# Patient Record
Sex: Female | Born: 1940 | Race: White | Hispanic: No | Smoking: Former smoker
Health system: Southern US, Community
[De-identification: ages and names within clinical notes are randomized; demographics above are authoritative.]

## PROBLEM LIST (undated history)

## (undated) DIAGNOSIS — L089 Local infection of the skin and subcutaneous tissue, unspecified: Secondary | ICD-10-CM

## (undated) DIAGNOSIS — F419 Anxiety disorder, unspecified: Secondary | ICD-10-CM

## (undated) DIAGNOSIS — R002 Palpitations: Secondary | ICD-10-CM

## (undated) DIAGNOSIS — I10 Essential (primary) hypertension: Secondary | ICD-10-CM

## (undated) DIAGNOSIS — R51 Headache: Secondary | ICD-10-CM

## (undated) HISTORY — DX: Essential (primary) hypertension: I10

## (undated) HISTORY — DX: Anxiety disorder, unspecified: F41.9

## (undated) HISTORY — DX: Headache: R51

## (undated) HISTORY — DX: Palpitations: R00.2

## (undated) HISTORY — DX: Local infection of the skin and subcutaneous tissue, unspecified: L08.9

---

## 1997-10-09 ENCOUNTER — Other Ambulatory Visit: Admission: RE | Admit: 1997-10-09 | Discharge: 1997-10-09 | Payer: Self-pay | Admitting: *Deleted

## 1999-01-06 ENCOUNTER — Other Ambulatory Visit: Admission: RE | Admit: 1999-01-06 | Discharge: 1999-01-06 | Payer: Self-pay | Admitting: *Deleted

## 1999-06-17 ENCOUNTER — Other Ambulatory Visit: Admission: RE | Admit: 1999-06-17 | Discharge: 1999-06-17 | Payer: Self-pay | Admitting: *Deleted

## 2000-01-06 ENCOUNTER — Other Ambulatory Visit: Admission: RE | Admit: 2000-01-06 | Discharge: 2000-01-06 | Payer: Self-pay | Admitting: *Deleted

## 2001-01-15 ENCOUNTER — Other Ambulatory Visit: Admission: RE | Admit: 2001-01-15 | Discharge: 2001-01-15 | Payer: Self-pay | Admitting: *Deleted

## 2004-07-30 ENCOUNTER — Ambulatory Visit: Payer: Self-pay | Admitting: Internal Medicine

## 2005-03-22 ENCOUNTER — Ambulatory Visit: Payer: Self-pay | Admitting: Internal Medicine

## 2008-10-12 ENCOUNTER — Emergency Department (HOSPITAL_COMMUNITY): Admission: EM | Admit: 2008-10-12 | Discharge: 2008-10-12 | Payer: Self-pay | Admitting: Emergency Medicine

## 2009-06-17 ENCOUNTER — Encounter: Payer: Self-pay | Admitting: Internal Medicine

## 2009-06-18 ENCOUNTER — Encounter: Payer: Self-pay | Admitting: Internal Medicine

## 2009-06-18 ENCOUNTER — Ambulatory Visit (HOSPITAL_BASED_OUTPATIENT_CLINIC_OR_DEPARTMENT_OTHER): Admission: RE | Admit: 2009-06-18 | Discharge: 2009-06-18 | Payer: Self-pay | Admitting: Orthopedic Surgery

## 2009-06-24 ENCOUNTER — Encounter: Payer: Self-pay | Admitting: Internal Medicine

## 2009-07-12 ENCOUNTER — Encounter: Payer: Self-pay | Admitting: Internal Medicine

## 2009-07-14 ENCOUNTER — Ambulatory Visit: Payer: Self-pay | Admitting: Internal Medicine

## 2009-07-14 DIAGNOSIS — R197 Diarrhea, unspecified: Secondary | ICD-10-CM

## 2009-07-14 DIAGNOSIS — R110 Nausea: Secondary | ICD-10-CM | POA: Insufficient documentation

## 2009-07-14 DIAGNOSIS — R519 Headache, unspecified: Secondary | ICD-10-CM | POA: Insufficient documentation

## 2009-07-14 DIAGNOSIS — R51 Headache: Secondary | ICD-10-CM

## 2010-03-09 NOTE — Letter (Signed)
Summary: [facility] [place]   Imported By: Maryln Gottron 07/23/2009 15:09:17  _____________________________________________________________________  External Attachment:    Type:   Image     Comment:   External Document

## 2010-03-09 NOTE — Letter (Signed)
Summary: [facility] [place]   Imported By: Maryln Gottron 07/23/2009 15:10:40  _____________________________________________________________________  External Attachment:    Type:   Image     Comment:   External Document

## 2010-03-09 NOTE — Assessment & Plan Note (Signed)
Summary: re-est ok per kim//ccm                                                                                                 Vital Signs:  Patient profile:   70 year old female Height:      63 inches Weight:      140 pounds BMI:     24.89 Temp:     98.2 degrees F oral BP sitting:   124 / 76  (right arm) Cuff size:   regular  Vitals Entered By: Duard Brady LPN (July 14, 100 11:20 AM) CC: c/o diarrhea and chills , on antibiotics r/t hand surg 3 wks ago Is Patient Diabetic? No   CC:  c/o diarrhea and chills  and on antibiotics r/t hand surg 3 wks ago.  History of Present Illness: 70 year old patient who has been seen at the urgent care recently due to an acute febrile illness.  She has been treated with the Levaquin and doxycycline and more recently has completed sulfamethoxazole following a cat bite and surgery.  She was last seen at the time care on Sunday with fever and chills.  Today she is improved, but still having persistent nausea and diarrhea.  There is been no vomiting.  Denies any abdominal pain.  Her left hand wound infection has nicely healed  Preventive Screening-Counseling & Management  Alcohol-Tobacco     Smoking Status: never  Allergies: 1)  ! Penicillin V Potassium (Penicillin V Potassium)  Social History: Smoking Status:  never  Review of Systems       The patient complains of anorexia and fever.  The patient denies weight loss, weight gain, vision loss, decreased hearing, hoarseness, chest pain, syncope, dyspnea on exertion, peripheral edema, prolonged cough, headaches, hemoptysis, abdominal pain, melena, hematochezia, severe indigestion/heartburn, hematuria, incontinence, genital sores, muscle weakness, suspicious skin lesions, transient blindness, difficulty walking, depression, unusual weight change, abnormal bleeding, enlarged lymph nodes, angioedema, and breast masses.    Physical Exam  General:  Well-developed,well-nourished,in no acute  distress; alert,appropriate and cooperative throughout examination Head:  Normocephalic and atraumatic without obvious abnormalities. No apparent alopecia or balding. Eyes:  No corneal or conjunctival inflammation noted. EOMI. Perrla. Funduscopic exam benign, without hemorrhages, exudates or papilledema. Vision grossly normal. Ears:  External ear exam shows no significant lesions or deformities.  Otoscopic examination reveals clear canals, tympanic membranes are intact bilaterally without bulging, retraction, inflammation or discharge. Hearing is grossly normal bilaterally. Mouth:  Oral mucosa and oropharynx without lesions or exudates.  Teeth in good repair. Neck:  No deformities, masses, or tenderness noted. Lungs:  Normal respiratory effort, chest expands symmetrically. Lungs are clear to auscultation, no crackles or wheezes. Heart:  Normal rate and regular rhythm. S1 and S2 normal without gallop, murmur, click, rub or other extra sounds. Abdomen:  Bowel sounds positive,abdomen soft and non-tender without masses, organomegaly or hernias noted. Msk:  No deformity or scoliosis noted of thoracic or lumbar spine.   Pulses:  R and L carotid,radial,femoral,dorsalis pedis and posterior tibial pulses are full and equal bilaterally Extremities:  No clubbing, cyanosis, edema, or  deformity noted with normal full range of motion of all joints.     Impression & Recommendations:  Problem # 1:  NAUSEA ALONE (ICD-787.02)  Problem # 2:  DIARRHEA, ACUTE (ICD-787.91)  Complete Medication List: 1)  Caltrate 600+d 600-400 Mg-unit Tabs (Calcium carbonate-vitamin d) 2)  Levaquin  .... For 3 weeks 3)  Doxycycline  .... For 3 weeks 4)  Sulfamethoxazole Tmp  .... For 3 weeks 5)  Promethazine Hcl 25 Mg Tabs (Promethazine hcl) .... One every 6 hours for nausea  Patient Instructions: 1)  Drink clear liquids only for the next 24 hours, then slowly add other liquids and food as you  tolerate them. 2)  Align one  daily Prescriptions: PROMETHAZINE HCL 25 MG TABS (PROMETHAZINE HCL) one every 6 hours for nausea  #20 x 0   Entered and Authorized by:   Gordy Savers  MD   Signed by:   Gordy Savers  MD on 07/14/2009   Method used:   Print then Give to Patient   RxID:   [id number]

## 2010-03-09 NOTE — Op Note (Signed)
Summary: Incision and drainage of cat bite wound-Dr. Molly Maduro Sypher  Incision and drainage of cat bite wound-Dr. Molly Maduro Sypher   Imported By: Maryln Gottron 07/23/2009 15:12:35  _____________________________________________________________________  External Attachment:    Type:   Image     Comment:   External Document

## 2010-03-09 NOTE — Letter (Signed)
Summary: Records from [facility] 05/06/09 - 07/12/09  Records from [facility] 05/06/09 - 07/12/09   Imported By: Maryln Gottron 07/21/2009 12:49:28  _____________________________________________________________________  External Attachment:    Type:   Image     Comment:   External Document

## 2010-04-13 ENCOUNTER — Encounter (INDEPENDENT_AMBULATORY_CARE_PROVIDER_SITE_OTHER): Payer: Self-pay | Admitting: *Deleted

## 2010-04-13 ENCOUNTER — Other Ambulatory Visit: Payer: Self-pay | Admitting: Internal Medicine

## 2010-04-13 ENCOUNTER — Other Ambulatory Visit: Payer: MEDICARE

## 2010-04-13 DIAGNOSIS — Z0000 Encounter for general adult medical examination without abnormal findings: Secondary | ICD-10-CM

## 2010-04-13 DIAGNOSIS — E785 Hyperlipidemia, unspecified: Secondary | ICD-10-CM

## 2010-04-13 LAB — CBC WITH DIFFERENTIAL/PLATELET
Basophils Relative: 0.9 % (ref 0.0–3.0)
Eosinophils Absolute: 0.2 10*3/uL (ref 0.0–0.7)
Hemoglobin: 12.9 g/dL (ref 12.0–15.0)
MCHC: 34.4 g/dL (ref 30.0–36.0)
MCV: 91 fl (ref 78.0–100.0)
Monocytes Absolute: 0.7 10*3/uL (ref 0.1–1.0)
Neutro Abs: 4.9 10*3/uL (ref 1.4–7.7)
Neutrophils Relative %: 58.2 % (ref 43.0–77.0)
RBC: 4.13 Mil/uL (ref 3.87–5.11)

## 2010-04-13 LAB — HEPATIC FUNCTION PANEL
AST: 23 U/L (ref 0–37)
Albumin: 4.2 g/dL (ref 3.5–5.2)
Alkaline Phosphatase: 87 U/L (ref 39–117)
Total Protein: 7.1 g/dL (ref 6.0–8.3)

## 2010-04-13 LAB — LIPID PANEL: Cholesterol: 207 mg/dL — ABNORMAL HIGH (ref 0–200)

## 2010-04-13 LAB — BASIC METABOLIC PANEL
BUN: 13 mg/dL (ref 6–23)
Creatinine, Ser: 0.7 mg/dL (ref 0.4–1.2)
GFR: 84.79 mL/min (ref >60.00)

## 2010-04-13 LAB — URINALYSIS, ROUTINE W REFLEX MICROSCOPIC
Ketones, ur: NEGATIVE
Urine Glucose: NEGATIVE
Urobilinogen, UA: 0.2 (ref 0.0–1.0)

## 2010-04-22 ENCOUNTER — Encounter: Payer: Self-pay | Admitting: Internal Medicine

## 2010-04-23 ENCOUNTER — Encounter: Payer: Self-pay | Admitting: Internal Medicine

## 2010-04-23 ENCOUNTER — Ambulatory Visit (INDEPENDENT_AMBULATORY_CARE_PROVIDER_SITE_OTHER): Payer: MEDICARE | Admitting: Internal Medicine

## 2010-04-23 VITALS — BP 128/80 | HR 78 | Temp 98.8°F | Resp 14 | Ht 62.0 in | Wt 143.0 lb

## 2010-04-23 DIAGNOSIS — Z136 Encounter for screening for cardiovascular disorders: Secondary | ICD-10-CM

## 2010-04-23 DIAGNOSIS — Z0000 Encounter for general adult medical examination without abnormal findings: Secondary | ICD-10-CM

## 2010-04-23 NOTE — Patient Instructions (Signed)
colonoscopy   It is important that you exercise regularly, at least 20 minutes 3 to 4 times per week.  If you develop chest pain or shortness of breath seek  medical attention.  Take a calcium supplement, plus [phone] units of vitamin D  Schedule your mammogram.

## 2010-04-23 NOTE — Progress Notes (Signed)
Subjective:    Patient ID: Sydney Lucas, female    DOB: 08/09/1940, 70 y.o.   MRN: [id number]  HPI   70 year old patient who is seen today for a preventative health examination. She enjoys excellent health. She takes no chronic medications. She is followed by GYN and does take calcium and vitamin D supplements. She is quite active physically. 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  Past Medical History  Diagnosis Date  . Headache    No past surgical history on file.  reports that she quit smoking about 42 years ago. She has never used smokeless tobacco. She reports that she does not drink alcohol or use illicit drugs. family history is not on file. Allergies  Allergen Reactions  . Penicillins     REACTION: rash     1. Risk factors, based on past  M,S,F history-  No cardiovascular risk factors  2.  Physical activities: remains quite active. Exercises regularly at home. No exercise limitations.  3.  Depression/mood: no history of depression or mood disorder  4.  Hearing: no hearing deficits  5.  ADL's: independent in all aspects of daily living  6.  Fall risk: low  7.  Home safety: no problems identified  8.  Height weight, and visual acuity; height and weight stable. No change in visual acuity 9.  Counseling: heart healthy diet encouraged screening colonoscopy encouraged  10. Lab orders based on risk factors: laboratory profile including lipid panel and TSH reviewed 11. Referral-  We'll refer her for colonoscopy 12. Care plan:   Heart healthy diet regular exercise all encouraged we'll continue calcium and vitamin D supplements.  13. Cognitive assessment:  Lower down it with normal affect no history of cognitive dysfunction     Review of Systems  Constitutional: Negative for fever, appetite change, fatigue and unexpected weight change.  HENT: Negative for hearing loss, ear pain, nosebleeds, congestion, sore throat, mouth sores,  trouble swallowing, neck stiffness, dental problem, voice change, sinus pressure and tinnitus.   Eyes: Negative for photophobia, pain, redness and visual disturbance.  Respiratory: Negative for cough, chest tightness and shortness of breath.   Cardiovascular: Negative for chest pain, palpitations and leg swelling.  Gastrointestinal: Negative for nausea, vomiting, abdominal pain, diarrhea, constipation, blood in stool, abdominal distention and rectal pain.  Genitourinary: Negative for dysuria, urgency, frequency, hematuria, flank pain, vaginal bleeding, vaginal discharge, difficulty urinating, genital sores, vaginal pain, menstrual problem and pelvic pain.  Musculoskeletal: Negative for back pain and arthralgias.  Skin: Negative for rash.  Neurological: Negative for dizziness, syncope, speech difficulty, weakness, light-headedness, numbness and headaches.  Hematological: Negative for adenopathy. Does not bruise/bleed easily.  Psychiatric/Behavioral: Negative for suicidal ideas, behavioral problems, self-injury, dysphoric mood and agitation. The patient is not nervous/anxious.        Objective:   Physical Exam  Constitutional: She is oriented to person, place, and time. She appears well-developed and well-nourished.  HENT:  Head: Normocephalic and atraumatic.  Right Ear: External ear normal.  Left Ear: External ear normal.  Mouth/Throat: Oropharynx is clear and moist.  Eyes: Conjunctivae and EOM are normal.  Neck: Normal range of motion. Neck supple. No JVD present. No thyromegaly present.  Cardiovascular: Normal rate, regular rhythm, normal heart sounds and intact distal pulses.   No murmur heard. Pulmonary/Chest: Effort normal and breath sounds normal. She has no wheezes. She has no rales.  Abdominal: Soft. Bowel sounds are normal. She exhibits no distension and no mass. There is no  tenderness. There is no rebound and no guarding.  Musculoskeletal: Normal range of motion. She exhibits no  edema and no tenderness.  Neurological: She is alert and oriented to person, place, and time. She has normal reflexes. No cranial nerve deficit. She exhibits normal muscle tone. Coordination normal.  Skin: Skin is warm and dry. No rash noted.  Psychiatric: She has a normal mood and affect. Her behavior is normal.          Assessment & Plan:   preventive health examination   We'll continue calcium and vitamin D supplements regular exercise encouraged heart healthy diet encouraged a screening colonoscopy will be scheduled

## 2010-04-27 LAB — ANAEROBIC CULTURE

## 2010-04-27 LAB — TISSUE CULTURE

## 2010-04-27 LAB — WOUND CULTURE: Culture: NO GROWTH

## 2011-04-14 ENCOUNTER — Ambulatory Visit (INDEPENDENT_AMBULATORY_CARE_PROVIDER_SITE_OTHER): Payer: Medicare Other | Admitting: Internal Medicine

## 2011-04-14 ENCOUNTER — Encounter: Payer: Self-pay | Admitting: Internal Medicine

## 2011-04-14 VITALS — BP 100/70 | Temp 98.3°F | Wt 142.0 lb

## 2011-04-14 DIAGNOSIS — K5289 Other specified noninfective gastroenteritis and colitis: Secondary | ICD-10-CM

## 2011-04-14 DIAGNOSIS — R0602 Shortness of breath: Secondary | ICD-10-CM

## 2011-04-14 DIAGNOSIS — I479 Paroxysmal tachycardia, unspecified: Secondary | ICD-10-CM

## 2011-04-14 DIAGNOSIS — K529 Noninfective gastroenteritis and colitis, unspecified: Secondary | ICD-10-CM

## 2011-04-14 MED ORDER — VERAPAMIL HCL 80 MG PO TABS: 80.0000 mg | ORAL_TABLET | Freq: Three times a day (TID) | ORAL | Status: DC

## 2011-04-14 MED ORDER — PROMETHAZINE HCL 25 MG PO TABS: 25.0000 mg | ORAL_TABLET | Freq: Four times a day (QID) | ORAL | Status: DC | PRN

## 2011-04-14 NOTE — Progress Notes (Signed)
  Subjective:    Patient ID: Sydney Lucas, female    DOB: 1940/06/02, 71 y.o.   MRN: [id number]  HPI 71 year old patient who presents with a four-day history of nausea vomiting and diarrhea. The vomiting lasted approximately 12 hours but she remains nauseated her diarrhea is also tapered. She has been able to tolerate fluids. She feels that she is steadily improving;  no abnormal pain fever or chills.     Review of Systems  Constitutional: Negative.   HENT: Negative for hearing loss, congestion, sore throat, rhinorrhea, dental problem, sinus pressure and tinnitus.   Eyes: Negative for pain, discharge and visual disturbance.  Respiratory: Negative for cough and shortness of breath.   Cardiovascular: Negative for chest pain, palpitations and leg swelling.  Gastrointestinal: Positive for nausea, vomiting and diarrhea. Negative for abdominal pain, constipation, blood in stool and abdominal distention.  Genitourinary: Negative for dysuria, urgency, frequency, hematuria, flank pain, vaginal bleeding, vaginal discharge, difficulty urinating, vaginal pain and pelvic pain.  Musculoskeletal: Negative for joint swelling, arthralgias and gait problem.  Skin: Negative for rash.  Neurological: Negative for dizziness, syncope, speech difficulty, weakness, numbness and headaches.  Hematological: Negative for adenopathy.  Psychiatric/Behavioral: Negative for behavioral problems, dysphoric mood and agitation. The patient is not nervous/anxious.        Objective:   Physical Exam  Constitutional: She is oriented to person, place, and time. She appears well-developed and well-nourished.  HENT:  Head: Normocephalic.  Right Ear: External ear normal.  Left Ear: External ear normal.  Mouth/Throat: Oropharynx is clear and moist.  Eyes: Conjunctivae and EOM are normal. Pupils are equal, round, and reactive to light.  Neck: Normal range of motion. Neck supple. No thyromegaly present.    Cardiovascular: Normal heart sounds and intact distal pulses.        Pulse was fast and irregular  Pulmonary/Chest: Effort normal and breath sounds normal.  Abdominal: Soft. Bowel sounds are normal. She exhibits no distension and no mass. There is no tenderness. There is no rebound and no guarding.  Musculoskeletal: Normal range of motion.  Lymphadenopathy:    She has no cervical adenopathy.  Neurological: She is alert and oriented to person, place, and time.  Skin: Skin is warm and dry. No rash noted.  Psychiatric: She has a normal mood and affect. Her behavior is normal.          Assessment & Plan:  Viral gastroenteritis. We'll continue symptomatic treatment with anti-emetics and antidiarrheals. We'll force fluids we'll treat with Phenergan and gradually advance diet  Atrial fibrillation. EKG was performed and did confirm atrial fibrillation. Will place on breast milk 80 mg 3 times daily and recheck tomorrow. If remains in atrial fibrillation, we'll evaluate and treat further

## 2011-04-14 NOTE — Patient Instructions (Addendum)
The main concern with gastroenteritis is dehydration.  Drink  plenty of  fluids, and advance your diet  slowly to solids as you feel improved.  If you are unable to keep anything down or  Show  signs of dehydration such as dry, cracked lips, or  not urinating, please notify our office.  Verapamil 80 mg 3 times daily  Return in 24 hours for further evaluation  Viral Gastroenteritis Viral gastroenteritis is also known as stomach flu. This condition affects the stomach and intestinal tract. It can cause sudden diarrhea and vomiting. The illness typically lasts 3 to 8 days. Most people develop an immune response that eventually gets rid of the virus. While this natural response develops, the virus can make you quite ill. CAUSES   Many different viruses can cause gastroenteritis, such as rotavirus or noroviruses. You can catch one of these viruses by consuming contaminated food or water. You may also catch a virus by sharing utensils or other personal items with an infected person or by touching a contaminated surface. SYMPTOMS   The most common symptoms are diarrhea and vomiting. These problems can cause a severe loss of body fluids (dehydration) and a body salt (electrolyte) imbalance. Other symptoms may include:  Fever.   Headache.   Fatigue.   Abdominal pain.  DIAGNOSIS   Your caregiver can usually diagnose viral gastroenteritis based on your symptoms and a physical exam. A stool sample may also be taken to test for the presence of viruses or other infections. TREATMENT   This illness typically goes away on its own. Treatments are aimed at rehydration. The most serious cases of viral gastroenteritis involve vomiting so severely that you are not able to keep fluids down. In these cases, fluids must be given through an intravenous line (IV). HOME CARE INSTRUCTIONS    Drink enough fluids to keep your urine clear or pale yellow. Drink small amounts of fluids frequently and increase the amounts  as tolerated.   Ask your caregiver for specific rehydration instructions.   Avoid:   Foods high in sugar.   Alcohol.   Carbonated drinks.   Tobacco.   Juice.   Caffeine drinks.   Extremely hot or cold fluids.   Fatty, greasy foods.   Too much intake of anything at one time.   Dairy products until 24 to 48 hours after diarrhea stops.   You may consume probiotics. Probiotics are active cultures of beneficial bacteria. They may lessen the amount and number of diarrheal stools in adults. Probiotics can be found in yogurt with active cultures and in supplements.   Wash your hands well to avoid spreading the virus.   Only take over-the-counter or prescription medicines for pain, discomfort, or fever as directed by your caregiver. Do not give aspirin to children. Antidiarrheal medicines are not recommended.   Ask your caregiver if you should continue to take your regular prescribed and over-the-counter medicines.   Keep all follow-up appointments as directed by your caregiver.  SEEK IMMEDIATE MEDICAL CARE IF:    You are unable to keep fluids down.   You do not urinate at least once every 6 to 8 hours.   You develop shortness of breath.   You notice blood in your stool or vomit. This may look like coffee grounds.   You have abdominal pain that increases or is concentrated in one small area (localized).   You have persistent vomiting or diarrhea.   You have a fever.   The patient is a  child younger than 3 months, and he or she has a fever.   The patient is a child older than 3 months, and he or she has a fever and persistent symptoms.   The patient is a child older than 3 months, and he or she has a fever and symptoms suddenly get worse.   The patient is a baby, and he or she has no tears when crying.  MAKE SURE YOU:    Understand these instructions.   Will watch your condition.   Will get help right away if you are not doing well or get worse.  Document  Released: 01/24/2005 Document Revised: 01/13/2011 Document Reviewed: 11/10/2010 [facility] Patient Information 2012 [place], [place].

## 2011-04-15 ENCOUNTER — Encounter: Payer: Self-pay | Admitting: Internal Medicine

## 2011-04-15 ENCOUNTER — Ambulatory Visit (INDEPENDENT_AMBULATORY_CARE_PROVIDER_SITE_OTHER): Payer: Medicare Other | Admitting: Internal Medicine

## 2011-04-15 VITALS — BP 120/70 | HR 72 | Temp 98.4°F

## 2011-04-15 DIAGNOSIS — I4891 Unspecified atrial fibrillation: Secondary | ICD-10-CM

## 2011-04-15 DIAGNOSIS — I480 Paroxysmal atrial fibrillation: Secondary | ICD-10-CM | POA: Insufficient documentation

## 2011-04-15 NOTE — Progress Notes (Signed)
  Subjective:    Patient ID: Sydney Lucas, female    DOB: 1940/06/06, 71 y.o.   MRN: [id number]  HPI 71 year old patient who was seen yesterday due to the acute illness. Today she is feeling much better. Yesterday she was noted to have an irregular rhythm. EKG confirmed atrial fibrillation.   Review of Systems  Constitutional: Negative.   HENT: Negative for hearing loss, congestion, sore throat, rhinorrhea, dental problem, sinus pressure and tinnitus.   Eyes: Negative for pain, discharge and visual disturbance.  Respiratory: Negative for cough and shortness of breath.   Cardiovascular: Negative for chest pain, palpitations (palpitations were noted by the patient yesterday but have not reoccurred) and leg swelling.  Gastrointestinal: Negative for nausea, vomiting, abdominal pain, diarrhea, constipation, blood in stool and abdominal distention.  Genitourinary: Negative for dysuria, urgency, frequency, hematuria, flank pain, vaginal bleeding, vaginal discharge, difficulty urinating, vaginal pain and pelvic pain.  Musculoskeletal: Negative for joint swelling, arthralgias and gait problem.  Skin: Negative for rash.  Neurological: Negative for dizziness, syncope, speech difficulty, weakness, numbness and headaches.  Hematological: Negative for adenopathy.  Psychiatric/Behavioral: Negative for behavioral problems, dysphoric mood and agitation. The patient is not nervous/anxious.        Objective:   Physical Exam  Constitutional: She appears well-developed and well-nourished. No distress.       Blood pressure normal Pulse is regular without ectopy          Assessment & Plan:   History of paroxysmal atrial fibrillation. Clinically patient is normal sinus rhythm at the present time. Options were discussed. It was elected to follow clinically the patient monitor her pulse and call for any irregularity.

## 2011-04-15 NOTE — Patient Instructions (Signed)
Notify if you note any irregularity to your pulse  Schedule your regular annual complete examination  Limit your sodium (Salt) intake

## 2011-04-26 LAB — HM COLONOSCOPY

## 2011-04-27 ENCOUNTER — Encounter: Payer: Self-pay | Admitting: Internal Medicine

## 2011-07-26 ENCOUNTER — Other Ambulatory Visit (INDEPENDENT_AMBULATORY_CARE_PROVIDER_SITE_OTHER): Payer: Medicare Other

## 2011-07-26 DIAGNOSIS — Z0000 Encounter for general adult medical examination without abnormal findings: Secondary | ICD-10-CM

## 2011-07-26 DIAGNOSIS — Z79899 Other long term (current) drug therapy: Secondary | ICD-10-CM

## 2011-07-26 LAB — CBC WITH DIFFERENTIAL/PLATELET
Basophils Relative: 1.1 % (ref 0.0–3.0)
Eosinophils Relative: 2.6 % (ref 0.0–5.0)
HCT: 36.2 % (ref 36.0–46.0)
Hemoglobin: 12.2 g/dL (ref 12.0–15.0)
Lymphs Abs: 2.5 10*3/uL (ref 0.7–4.0)
MCV: 92.2 fl (ref 78.0–100.0)
Monocytes Absolute: 0.6 10*3/uL (ref 0.1–1.0)
Neutro Abs: 3.7 10*3/uL (ref 1.4–7.7)
RBC: 3.93 Mil/uL (ref 3.87–5.11)
WBC: 7.1 10*3/uL (ref 4.5–10.5)

## 2011-07-26 LAB — POCT URINALYSIS DIPSTICK
Bilirubin, UA: NEGATIVE
Glucose, UA: NEGATIVE
Ketones, UA: NEGATIVE
Leukocytes, UA: NEGATIVE
pH, UA: 6.5

## 2011-07-26 LAB — LIPID PANEL
Cholesterol: 198 mg/dL (ref 0–200)
Triglycerides: 218 mg/dL — ABNORMAL HIGH (ref 0.0–149.0)

## 2011-07-26 LAB — HEPATIC FUNCTION PANEL
ALT: 18 U/L (ref 0–35)
AST: 25 U/L (ref 0–37)
Total Protein: 7.2 g/dL (ref 6.0–8.3)

## 2011-07-26 LAB — BASIC METABOLIC PANEL
BUN: 12 mg/dL (ref 6–23)
Chloride: 108 mEq/L (ref 96–112)
Potassium: 3.8 mEq/L (ref 3.5–5.1)
Sodium: 143 mEq/L (ref 135–145)

## 2011-07-27 LAB — LDL CHOLESTEROL, DIRECT: Direct LDL: 112.4 mg/dL

## 2011-08-01 ENCOUNTER — Encounter: Payer: Self-pay | Admitting: Internal Medicine

## 2011-08-01 LAB — HM MAMMOGRAPHY: HM Mammogram: NEGATIVE

## 2011-08-02 ENCOUNTER — Encounter: Payer: Self-pay | Admitting: Internal Medicine

## 2011-08-02 ENCOUNTER — Ambulatory Visit (INDEPENDENT_AMBULATORY_CARE_PROVIDER_SITE_OTHER): Payer: Medicare Other | Admitting: Internal Medicine

## 2011-08-02 VITALS — BP 120/70 | HR 82 | Temp 98.2°F | Resp 16 | Ht 62.0 in | Wt 145.0 lb

## 2011-08-02 DIAGNOSIS — Z86010 Personal history of colonic polyps: Secondary | ICD-10-CM

## 2011-08-02 DIAGNOSIS — I480 Paroxysmal atrial fibrillation: Secondary | ICD-10-CM

## 2011-08-02 DIAGNOSIS — Z0000 Encounter for general adult medical examination without abnormal findings: Secondary | ICD-10-CM

## 2011-08-02 DIAGNOSIS — I4891 Unspecified atrial fibrillation: Secondary | ICD-10-CM

## 2011-08-02 NOTE — Progress Notes (Signed)
Subjective:    Patient ID: Sydney Lucas, female    DOB: 02/01/1941, 71 y.o.   MRN: [id number]  HPI  71 year-old patient who is seen today for a preventative health examination. She enjoys excellent health. She takes no chronic medications. She is followed by GYN and does take calcium and vitamin D supplements. She is quite active physically. 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  Approximately 3 months ago she presented with paroxysmal atrial fibrillation in the setting of an acute diarrheal illness. She was seen the following day and had converted to a normal sinus rhythm. Over the past 3 months she has had a single episode of irregular heart rhythm lasting 4-5 minutes.  She's had a recent gynecologic exam mammogram and had a colonoscopy earlier this year. This revealed colonic polyps  Past medical history is fairly unremarkable. She is a gravida 1 para we'll want abortus 0 No hospitalizations. She takes no chronic medications Surgical history none except for outpatient hand surgery  Social history nonsmoker  Family history mother is 71 history of type 2 diabetes father died 24 history of heart failure and bladder cancer one sister age 36 is in good health  Past Medical History  Diagnosis Date  . Headache    No past surgical history on file.  reports that she quit smoking about 43 years ago. She has never used smokeless tobacco. She reports that she does not drink alcohol or use illicit drugs. family history is not on file. Allergies  Allergen Reactions  . Penicillins     REACTION: rash     1. Risk factors, based on past  M,S,F history-  No cardiovascular risk factors  2.  Physical activities: remains quite active. Exercises regularly at home. No exercise limitations.  3.  Depression/mood: no history of depression or mood disorder  4.  Hearing: no hearing deficits  5.  ADL's: independent in all aspects of daily living  6.  Fall  risk: low  7.  Home safety: no problems identified  8.  Height weight, and visual acuity; height and weight stable. No change in visual acuity 9.  Counseling: heart healthy diet encouraged screening colonoscopy encouraged  10. Lab orders based on risk factors: laboratory profile including lipid panel and TSH reviewed 11. Referral-  We'll refer her for colonoscopy 12. Care plan:   Heart healthy diet regular exercise all encouraged we'll continue calcium and vitamin D supplements.  13. Cognitive assessment:  Lower down it with normal affect no history of cognitive dysfunction     Review of Systems  Constitutional: Negative for fever, appetite change, fatigue and unexpected weight change.  HENT: Negative for hearing loss, ear pain, nosebleeds, congestion, sore throat, mouth sores, trouble swallowing, neck stiffness, dental problem, voice change, sinus pressure and tinnitus.   Eyes: Negative for photophobia, pain, redness and visual disturbance.  Respiratory: Negative for cough, chest tightness and shortness of breath.   Cardiovascular: Positive for palpitations (single episode of palpitations lasting 4-5 minutes). Negative for chest pain and leg swelling.  Gastrointestinal: Negative for nausea, vomiting, abdominal pain, diarrhea, constipation, blood in stool, abdominal distention and rectal pain.  Genitourinary: Negative for dysuria, urgency, frequency, hematuria, flank pain, vaginal bleeding, vaginal discharge, difficulty urinating, genital sores, vaginal pain, menstrual problem and pelvic pain.  Musculoskeletal: Negative for back pain and arthralgias.  Skin: Negative for rash.  Neurological: Negative for dizziness, syncope, speech difficulty, weakness, light-headedness, numbness and headaches.  Hematological: Negative for adenopathy. Does  not bruise/bleed easily.  Psychiatric/Behavioral: Negative for suicidal ideas, behavioral problems, self-injury, dysphoric mood and agitation. The  patient is not nervous/anxious.        Objective:   Physical Exam  Constitutional: She is oriented to person, place, and time. She appears well-developed and well-nourished.  HENT:  Head: Normocephalic and atraumatic.  Right Ear: External ear normal.  Left Ear: External ear normal.  Mouth/Throat: Oropharynx is clear and moist.  Eyes: Conjunctivae and EOM are normal.  Neck: Normal range of motion. Neck supple. No JVD present. No thyromegaly present.  Cardiovascular: Normal rate, regular rhythm, normal heart sounds and intact distal pulses.   No murmur heard. Pulmonary/Chest: Effort normal and breath sounds normal. She has no wheezes. She has no rales.  Abdominal: Soft. Bowel sounds are normal. She exhibits no distension and no mass. There is no tenderness. There is no rebound and no guarding.  Musculoskeletal: Normal range of motion. She exhibits no edema and no tenderness.  Neurological: She is alert and oriented to person, place, and time. She has normal reflexes. No cranial nerve deficit. She exhibits normal muscle tone. Coordination normal.  Skin: Skin is warm and dry. No rash noted.  Psychiatric: She has a normal mood and affect. Her behavior is normal.          Assessment & Plan:   preventive health examination   We'll continue calcium and vitamin D supplements regular exercise encouraged heart healthy diet encouraged a screening colonoscopy will be scheduled  Paroxysmal atrial fibrillation. We'll schedule a 2-D echocardiogram. We'll recommend aspirin 81 mg daily. If 2-D echocardiogram is normal,  we'll continue aspirin therapy only with a CHADS score of 0  History colonic polyps. Followup 3 years

## 2011-08-02 NOTE — Patient Instructions (Signed)
Take a calcium supplement, plus [phone] units of vitamin D    It is important that you exercise regularly, at least 20 minutes 3 to 4 times per week.  If you develop chest pain or shortness of breath seek  medical attention.  Aspirin 81 mg daily

## 2011-08-09 ENCOUNTER — Ambulatory Visit (HOSPITAL_COMMUNITY): Payer: Medicare Other | Attending: Cardiology | Admitting: Radiology

## 2011-08-09 DIAGNOSIS — I059 Rheumatic mitral valve disease, unspecified: Secondary | ICD-10-CM | POA: Insufficient documentation

## 2011-08-09 DIAGNOSIS — R002 Palpitations: Secondary | ICD-10-CM | POA: Insufficient documentation

## 2011-08-09 DIAGNOSIS — I359 Nonrheumatic aortic valve disorder, unspecified: Secondary | ICD-10-CM | POA: Insufficient documentation

## 2011-08-09 DIAGNOSIS — I4891 Unspecified atrial fibrillation: Secondary | ICD-10-CM | POA: Insufficient documentation

## 2011-08-09 DIAGNOSIS — I480 Paroxysmal atrial fibrillation: Secondary | ICD-10-CM

## 2011-08-09 NOTE — Progress Notes (Signed)
Echocardiogram performed.  

## 2011-08-12 NOTE — Progress Notes (Signed)
Quick Note:  Attempt to call- VM - LMTCB if questions - test normal ______

## 2012-08-16 ENCOUNTER — Other Ambulatory Visit (INDEPENDENT_AMBULATORY_CARE_PROVIDER_SITE_OTHER): Payer: Medicare Other

## 2012-08-16 DIAGNOSIS — Z0000 Encounter for general adult medical examination without abnormal findings: Secondary | ICD-10-CM

## 2012-08-16 DIAGNOSIS — I4891 Unspecified atrial fibrillation: Secondary | ICD-10-CM

## 2012-08-16 LAB — BASIC METABOLIC PANEL
CO2: 25 mEq/L (ref 19–32)
Calcium: 9.4 mg/dL (ref 8.4–10.5)
Chloride: 105 mEq/L (ref 96–112)
Creatinine, Ser: 0.7 mg/dL (ref 0.4–1.2)
Sodium: 141 mEq/L (ref 135–145)

## 2012-08-16 LAB — POCT URINALYSIS DIPSTICK
Ketones, UA: NEGATIVE
Leukocytes, UA: NEGATIVE
Nitrite, UA: NEGATIVE
Protein, UA: NEGATIVE
Urobilinogen, UA: 0.2
pH, UA: 7

## 2012-08-16 LAB — LIPID PANEL
HDL: 41.2 mg/dL (ref >39.00)
Total CHOL/HDL Ratio: 5
Triglycerides: 270 mg/dL — ABNORMAL HIGH (ref 0.0–149.0)
VLDL: 54 mg/dL — ABNORMAL HIGH (ref 0.0–40.0)

## 2012-08-16 LAB — HEPATIC FUNCTION PANEL
Albumin: 4 g/dL (ref 3.5–5.2)
Alkaline Phosphatase: 78 U/L (ref 39–117)
Total Protein: 7.1 g/dL (ref 6.0–8.3)

## 2012-08-16 LAB — CBC WITH DIFFERENTIAL/PLATELET
Basophils Relative: 1 % (ref 0.0–3.0)
Eosinophils Absolute: 0.3 10*3/uL (ref 0.0–0.7)
Eosinophils Relative: 3.9 % (ref 0.0–5.0)
Hemoglobin: 12.2 g/dL (ref 12.0–15.0)
Lymphocytes Relative: 33.1 % (ref 12.0–46.0)
MCHC: 33.9 g/dL (ref 30.0–36.0)
Neutro Abs: 4.3 10*3/uL (ref 1.4–7.7)
Neutrophils Relative %: 53.4 % (ref 43.0–77.0)
RBC: 3.88 Mil/uL (ref 3.87–5.11)
WBC: 8.1 10*3/uL (ref 4.5–10.5)

## 2012-08-16 LAB — TSH: TSH: 1.43 u[IU]/mL (ref 0.35–5.50)

## 2012-08-16 LAB — LDL CHOLESTEROL, DIRECT: Direct LDL: 110.7 mg/dL

## 2012-08-23 ENCOUNTER — Ambulatory Visit (INDEPENDENT_AMBULATORY_CARE_PROVIDER_SITE_OTHER): Payer: Medicare Other | Admitting: Internal Medicine

## 2012-08-23 ENCOUNTER — Encounter: Payer: Self-pay | Admitting: Internal Medicine

## 2012-08-23 VITALS — BP 120/80 | HR 74 | Temp 98.3°F | Resp 20 | Ht 61.5 in | Wt 139.0 lb

## 2012-08-23 DIAGNOSIS — Z0000 Encounter for general adult medical examination without abnormal findings: Secondary | ICD-10-CM

## 2012-08-23 DIAGNOSIS — I480 Paroxysmal atrial fibrillation: Secondary | ICD-10-CM

## 2012-08-23 DIAGNOSIS — Z86010 Personal history of colonic polyps: Secondary | ICD-10-CM

## 2012-08-23 DIAGNOSIS — I4891 Unspecified atrial fibrillation: Secondary | ICD-10-CM

## 2012-08-23 MED ORDER — ASPIRIN EC 81 MG PO TBEC: 81.0000 mg | DELAYED_RELEASE_TABLET | Freq: Every day | ORAL | Status: DC

## 2012-08-23 NOTE — Patient Instructions (Addendum)
.    It is important that you exercise regularly, at least 20 minutes 3 to 4 times per week.  If you develop chest pain or shortness of breath seek  medical attention.  Return in one year for follow-up  Take a calcium supplement, plus [phone] units of vitamin D

## 2012-08-23 NOTE — Progress Notes (Signed)
Patient ID: Sydney Lucas, female   DOB: 1940/11/04, 72 y.o.   MRN: [id number]  Subjective:    Patient ID: Sydney Lucas, female    DOB: 1940-06-29, 72 y.o.   MRN: [id number]  HPI 59 ear-old patient who is seen today for a preventative health examination. She enjoys excellent health. She takes no chronic medications. She is followed by GYN and does take calcium and vitamin D supplements. She is quite active physically. 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  Approximately 15 months ago she presented with paroxysmal atrial fibrillation in the setting of an acute diarrheal illness. She was seen the following day and had converted to a normal sinus rhythm. Over the past year she has had no further palpitations. She remains on baby aspirin 81 mg daily   Colonoscopy 2013 - This revealed colonic polyps  Past medical history is fairly unremarkable. She is a gravida 1 para we'll want abortus 0 No hospitalizations. She takes no chronic medications Surgical history none except for outpatient hand surgery  Social history nonsmoker  Family history mother is 53 history of type 2 diabetes father died 66 history of heart failure and bladder cancer one sister age 31 is in good health  Past Medical History  Diagnosis Date  . Headache(784.0)    History reviewed. No pertinent past surgical history.  reports that she quit smoking about 44 years ago. She has never used smokeless tobacco. She reports that she does not drink alcohol or use illicit drugs. family history is not on file. Allergies  Allergen Reactions  . Penicillins     REACTION: rash     1. Risk factors, based on past  M,S,F history-  No cardiovascular risk factors  2.  Physical activities: remains quite active. Exercises regularly at home. No exercise limitations.  3.  Depression/mood: no history of depression or mood disorder  4.  Hearing: no hearing deficits  5.  ADL's:  independent in all aspects of daily living  6.  Fall risk: low  7.  Home safety: no problems identified  8.  Height weight, and visual acuity; height and weight stable. No change in visual acuity 9.  Counseling: heart healthy diet encouraged screening colonoscopy encouraged  10. Lab orders based on risk factors: laboratory profile including lipid panel and TSH reviewed 11. Referral-  We'll refer her for colonoscopy 12. Care plan:   Heart healthy diet regular exercise all encouraged we'll continue calcium and vitamin D supplements.  13. Cognitive assessment:  Lower down it with normal affect no history of cognitive dysfunction     Review of Systems  Constitutional: Negative for fever, appetite change, fatigue and unexpected weight change.  HENT: Negative for hearing loss, ear pain, nosebleeds, congestion, sore throat, mouth sores, trouble swallowing, neck stiffness, dental problem, voice change, sinus pressure and tinnitus.   Eyes: Negative for photophobia, pain, redness and visual disturbance.  Respiratory: Negative for cough, chest tightness and shortness of breath.   Cardiovascular: Positive for palpitations (single episode of palpitations lasting 4-5 minutes). Negative for chest pain and leg swelling.  Gastrointestinal: Negative for nausea, vomiting, abdominal pain, diarrhea, constipation, blood in stool, abdominal distention and rectal pain.  Genitourinary: Negative for dysuria, urgency, frequency, hematuria, flank pain, vaginal bleeding, vaginal discharge, difficulty urinating, genital sores, vaginal pain, menstrual problem and pelvic pain.  Musculoskeletal: Negative for back pain and arthralgias.  Skin: Negative for rash.  Neurological: Negative for dizziness, syncope, speech difficulty, weakness, light-headedness, numbness and  headaches.  Hematological: Negative for adenopathy. Does not bruise/bleed easily.  Psychiatric/Behavioral: Negative for suicidal ideas, behavioral  problems, self-injury, dysphoric mood and agitation. The patient is not nervous/anxious.        Objective:   Physical Exam  Constitutional: She is oriented to person, place, and time. She appears well-developed and well-nourished.  HENT:  Head: Normocephalic and atraumatic.  Right Ear: External ear normal.  Left Ear: External ear normal.  Mouth/Throat: Oropharynx is clear and moist.  Eyes: Conjunctivae and EOM are normal.  Neck: Normal range of motion. Neck supple. No JVD present. No thyromegaly present.  Cardiovascular: Normal rate, regular rhythm, normal heart sounds and intact distal pulses.   No murmur heard. Pulmonary/Chest: Effort normal and breath sounds normal. She has no wheezes. She has no rales.  Abdominal: Soft. Bowel sounds are normal. She exhibits no distension and no mass. There is no tenderness. There is no rebound and no guarding.  Musculoskeletal: Normal range of motion. She exhibits no edema and no tenderness.  Neurological: She is alert and oriented to person, place, and time. She has normal reflexes. No cranial nerve deficit. She exhibits normal muscle tone. Coordination normal.  Skin: Skin is warm and dry. No rash noted.  Psychiatric: She has a normal mood and affect. Her behavior is normal.          Assessment & Plan:  preventive health examination   We'll continue calcium and vitamin D supplements regular exercise encouraged heart healthy diet encouraged a screening colonoscopy will be scheduled  Paroxysmal atrial fibrillation. we'll continue aspirin therapy only with a CHADS score of  0  History colonic polyps. Followup 3 years

## 2013-09-20 ENCOUNTER — Other Ambulatory Visit (INDEPENDENT_AMBULATORY_CARE_PROVIDER_SITE_OTHER): Payer: Medicare Other

## 2013-09-20 DIAGNOSIS — Z0000 Encounter for general adult medical examination without abnormal findings: Secondary | ICD-10-CM

## 2013-09-20 DIAGNOSIS — I4891 Unspecified atrial fibrillation: Secondary | ICD-10-CM

## 2013-09-20 DIAGNOSIS — Z136 Encounter for screening for cardiovascular disorders: Secondary | ICD-10-CM

## 2013-09-20 LAB — POCT URINALYSIS DIPSTICK
Bilirubin, UA: NEGATIVE
GLUCOSE UA: NEGATIVE
Ketones, UA: NEGATIVE
LEUKOCYTES UA: NEGATIVE
NITRITE UA: NEGATIVE
Protein, UA: NEGATIVE
SPEC GRAV UA: 1.015
UROBILINOGEN UA: 0.2
pH, UA: 7

## 2013-09-20 LAB — LIPID PANEL
CHOLESTEROL: 194 mg/dL (ref 0–200)
HDL: 50.9 mg/dL (ref >39.00)
LDL Cholesterol: 111 mg/dL — ABNORMAL HIGH (ref 0–99)
NonHDL: 143.1
TRIGLYCERIDES: 163 mg/dL — AB (ref 0.0–149.0)
Total CHOL/HDL Ratio: 4
VLDL: 32.6 mg/dL (ref 0.0–40.0)

## 2013-09-20 LAB — HEPATIC FUNCTION PANEL
ALBUMIN: 3.9 g/dL (ref 3.5–5.2)
ALK PHOS: 72 U/L (ref 39–117)
ALT: 20 U/L (ref 0–35)
AST: 22 U/L (ref 0–37)
Bilirubin, Direct: 0.1 mg/dL (ref 0.0–0.3)
Total Bilirubin: 0.9 mg/dL (ref 0.2–1.2)
Total Protein: 7.2 g/dL (ref 6.0–8.3)

## 2013-09-20 LAB — CBC WITH DIFFERENTIAL/PLATELET
BASOS ABS: 0.1 10*3/uL (ref 0.0–0.1)
Basophils Relative: 1.3 % (ref 0.0–3.0)
EOS ABS: 0.2 10*3/uL (ref 0.0–0.7)
Eosinophils Relative: 2.8 % (ref 0.0–5.0)
HCT: 36.3 % (ref 36.0–46.0)
HEMOGLOBIN: 12.2 g/dL (ref 12.0–15.0)
LYMPHS PCT: 35.8 % (ref 12.0–46.0)
Lymphs Abs: 2.8 10*3/uL (ref 0.7–4.0)
MCHC: 33.6 g/dL (ref 30.0–36.0)
MCV: 91.7 fl (ref 78.0–100.0)
Monocytes Absolute: 0.7 10*3/uL (ref 0.1–1.0)
Monocytes Relative: 9 % (ref 3.0–12.0)
NEUTROS ABS: 4 10*3/uL (ref 1.4–7.7)
Neutrophils Relative %: 51.1 % (ref 43.0–77.0)
Platelets: 273 10*3/uL (ref 150.0–400.0)
RBC: 3.96 Mil/uL (ref 3.87–5.11)
RDW: 13.2 % (ref 11.5–15.5)
WBC: 7.7 10*3/uL (ref 4.0–10.5)

## 2013-09-20 LAB — BASIC METABOLIC PANEL
BUN: 16 mg/dL (ref 6–23)
CALCIUM: 9.9 mg/dL (ref 8.4–10.5)
CO2: 28 meq/L (ref 19–32)
CREATININE: 0.7 mg/dL (ref 0.4–1.2)
Chloride: 105 mEq/L (ref 96–112)
GFR: 91.26 mL/min (ref >60.00)
GLUCOSE: 83 mg/dL (ref 70–99)
Potassium: 4.6 mEq/L (ref 3.5–5.1)
SODIUM: 138 meq/L (ref 135–145)

## 2013-09-20 LAB — TSH: TSH: 1.7 u[IU]/mL (ref 0.35–4.50)

## 2013-09-27 ENCOUNTER — Ambulatory Visit (INDEPENDENT_AMBULATORY_CARE_PROVIDER_SITE_OTHER): Payer: Medicare Other | Admitting: Internal Medicine

## 2013-09-27 ENCOUNTER — Encounter: Payer: Self-pay | Admitting: Internal Medicine

## 2013-09-27 VITALS — BP 114/70 | HR 70 | Temp 98.1°F | Ht 62.0 in | Wt 137.0 lb

## 2013-09-27 DIAGNOSIS — Z86010 Personal history of colonic polyps: Secondary | ICD-10-CM

## 2013-09-27 DIAGNOSIS — I480 Paroxysmal atrial fibrillation: Secondary | ICD-10-CM

## 2013-09-27 DIAGNOSIS — I4891 Unspecified atrial fibrillation: Secondary | ICD-10-CM

## 2013-09-27 NOTE — Progress Notes (Signed)
Pre visit review using our clinic review tool, if applicable. No additional management support is needed unless otherwise documented below in the visit note. 

## 2013-09-27 NOTE — Patient Instructions (Addendum)

## 2013-09-27 NOTE — Progress Notes (Signed)
Subjective:    Patient ID: Sydney Lucas, female    DOB: 04/24/1940, 73 y.o.   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  She was running late for a lunch appointment and visit change to a followup visit. She has done quite well with a remote history of paroxysmal atrial fibrillation.  She has had no palpitations.  She remains on aspirin therapy only Additionally, she is on calcium and vitamin D supplements, as well as multivitamins. Doing quite well without concerns or complaints. She has a history of colonic polyps  Past Medical History  Diagnosis Date  . Headache(784.0)     History   Social History  . Marital Status: Married    Spouse Name: N/A    Number of Children: N/A  . Years of Education: N/A   Occupational History  . Not on file.   Social History Main Topics  . Smoking status: Former Smoker    Quit date: 02/08/1968  . Smokeless tobacco: Never Used  . Alcohol Use: No  . Drug Use: No  . Sexual Activity: Not on file   Other Topics Concern  . Not on file   Social History Narrative  . No narrative on file    History reviewed. No pertinent past surgical history.  History reviewed. No pertinent family history.  Allergies  Allergen Reactions  . Penicillins     REACTION: rash    Current Outpatient Prescriptions on File Prior to Visit  Medication Sig Dispense Refill  . aspirin EC 81 MG tablet Take 1 tablet (81 mg total) by mouth daily.      . Biotin 1000 MCG tablet Take 1,000 mcg by mouth daily.        . Calcium Carbonate-Vit D-Min (CALTRATE 600+D PLUS) 600-400 MG-UNIT per tablet Take 1 tablet by mouth daily.        . Magnesium 250 MG TABS Take by mouth daily.        . Multiple Vitamin (MULTIVITAMIN) tablet Take 1 tablet by mouth daily.        . vitamin B-12 (CYANOCOBALAMIN) 100 MCG tablet Take 50 mcg by mouth daily.        . vitamin C (ASCORBIC ACID) 500 MG tablet Take 500 mg by mouth  daily.       No current facility-administered medications on file prior to visit.    BP 114/70  Pulse 70  Temp(Src) 98.1 F (36.7 C) (Oral)  Ht 5\' 2"  (1.575 m)  Wt 137 lb (62.143 kg)  BMI 25.05 kg/m2  SpO2 96%     Review of Systems  Constitutional: Negative.   HENT: Negative for congestion, dental problem, hearing loss, rhinorrhea, sinus pressure, sore throat and tinnitus.   Eyes: Negative for pain, discharge and visual disturbance.  Respiratory: Negative for cough and shortness of breath.   Cardiovascular: Negative for chest pain, palpitations and leg swelling.  Gastrointestinal: Negative for nausea, vomiting, abdominal pain, diarrhea, constipation, blood in stool and abdominal distention.  Genitourinary: Negative for dysuria, urgency, frequency, hematuria, flank pain, vaginal bleeding, vaginal discharge, difficulty urinating, vaginal pain and pelvic pain.  Musculoskeletal: Negative for arthralgias, gait problem and joint swelling.  Skin: Negative for rash.  Neurological: Negative for dizziness, syncope, speech difficulty, weakness, numbness and headaches.  Hematological: Negative for adenopathy.  Psychiatric/Behavioral: Negative for behavioral problems, dysphoric mood and agitation. The patient is not nervous/anxious.        Objective:   Physical Exam  Constitutional: She is oriented to person, place, and time. She appears well-developed and well-nourished.  HENT:  Head: Normocephalic.  Right Ear: External ear normal.  Left Ear: External ear normal.  Mouth/Throat: Oropharynx is clear and moist.  Eyes: Conjunctivae and EOM are normal. Pupils are equal, round, and reactive to light.  Neck: Normal range of motion. Neck supple. No thyromegaly present.  Cardiovascular: Normal rate, regular rhythm, normal heart sounds and intact distal pulses.   Pulmonary/Chest: Effort normal and breath sounds normal.  Abdominal: Soft. Bowel sounds are normal. She exhibits no mass. There is  no tenderness.  Musculoskeletal: Normal range of motion.  Lymphadenopathy:    She has no cervical adenopathy.  Neurological: She is alert and oriented to person, place, and time.  Skin: Skin is warm and dry. No rash noted.  Psychiatric: She has a normal mood and affect. Her behavior is normal.          Assessment & Plan:   Paroxysmal atrial fibrillation.  Stable History of colonic polyps.  Followup colonoscopy 2018 Osteopenia  We'll continue her calcium and vitamin D supplements.  Continue active lifestyle and exercise regimen CPX in 6 months

## 2014-03-12 DIAGNOSIS — H43811 Vitreous degeneration, right eye: Secondary | ICD-10-CM | POA: Diagnosis not present

## 2014-03-12 DIAGNOSIS — H33192 Other retinoschisis and retinal cysts, left eye: Secondary | ICD-10-CM | POA: Diagnosis not present

## 2014-03-31 DIAGNOSIS — H43811 Vitreous degeneration, right eye: Secondary | ICD-10-CM | POA: Diagnosis not present

## 2014-03-31 DIAGNOSIS — H33102 Unspecified retinoschisis, left eye: Secondary | ICD-10-CM | POA: Diagnosis not present

## 2014-06-10 DIAGNOSIS — D122 Benign neoplasm of ascending colon: Secondary | ICD-10-CM | POA: Diagnosis not present

## 2014-06-10 DIAGNOSIS — Z86010 Personal history of colonic polyps: Secondary | ICD-10-CM | POA: Diagnosis not present

## 2014-09-23 ENCOUNTER — Other Ambulatory Visit (INDEPENDENT_AMBULATORY_CARE_PROVIDER_SITE_OTHER): Payer: Medicare Other

## 2014-09-23 DIAGNOSIS — Z0000 Encounter for general adult medical examination without abnormal findings: Secondary | ICD-10-CM

## 2014-09-23 LAB — HEPATIC FUNCTION PANEL
ALBUMIN: 4.1 g/dL (ref 3.5–5.2)
ALT: 17 U/L (ref 0–35)
AST: 21 U/L (ref 0–37)
Alkaline Phosphatase: 72 U/L (ref 39–117)
Bilirubin, Direct: 0.1 mg/dL (ref 0.0–0.3)
TOTAL PROTEIN: 7.2 g/dL (ref 6.0–8.3)
Total Bilirubin: 0.9 mg/dL (ref 0.2–1.2)

## 2014-09-23 LAB — CBC WITH DIFFERENTIAL/PLATELET
BASOS ABS: 0.1 10*3/uL (ref 0.0–0.1)
Basophils Relative: 0.8 % (ref 0.0–3.0)
EOS PCT: 2.3 % (ref 0.0–5.0)
Eosinophils Absolute: 0.2 10*3/uL (ref 0.0–0.7)
HEMATOCRIT: 35.7 % — AB (ref 36.0–46.0)
HEMOGLOBIN: 12.1 g/dL (ref 12.0–15.0)
LYMPHS ABS: 2.7 10*3/uL (ref 0.7–4.0)
LYMPHS PCT: 34.6 % (ref 12.0–46.0)
MCHC: 34 g/dL (ref 30.0–36.0)
MCV: 90.5 fl (ref 78.0–100.0)
MONOS PCT: 8.1 % (ref 3.0–12.0)
Monocytes Absolute: 0.6 10*3/uL (ref 0.1–1.0)
NEUTROS PCT: 54.2 % (ref 43.0–77.0)
Neutro Abs: 4.3 10*3/uL (ref 1.4–7.7)
Platelets: 284 10*3/uL (ref 150.0–400.0)
RBC: 3.94 Mil/uL (ref 3.87–5.11)
RDW: 13.4 % (ref 11.5–15.5)
WBC: 7.9 10*3/uL (ref 4.0–10.5)

## 2014-09-23 LAB — LIPID PANEL
CHOLESTEROL: 185 mg/dL (ref 0–200)
HDL: 42.3 mg/dL (ref >39.00)
LDL CALC: 106 mg/dL — AB (ref 0–99)
NONHDL: 142.34
Total CHOL/HDL Ratio: 4
Triglycerides: 183 mg/dL — ABNORMAL HIGH (ref 0.0–149.0)
VLDL: 36.6 mg/dL (ref 0.0–40.0)

## 2014-09-23 LAB — POCT URINALYSIS DIPSTICK
Bilirubin, UA: NEGATIVE
GLUCOSE UA: NEGATIVE
KETONES UA: NEGATIVE
Leukocytes, UA: NEGATIVE
Nitrite, UA: NEGATIVE
PROTEIN UA: NEGATIVE
SPEC GRAV UA: 1.015
UROBILINOGEN UA: 0.2
pH, UA: 5.5

## 2014-09-23 LAB — BASIC METABOLIC PANEL
BUN: 13 mg/dL (ref 6–23)
CALCIUM: 9.5 mg/dL (ref 8.4–10.5)
CO2: 29 mEq/L (ref 19–32)
Chloride: 104 mEq/L (ref 96–112)
Creatinine, Ser: 0.77 mg/dL (ref 0.40–1.20)
GFR: 77.51 mL/min (ref >60.00)
GLUCOSE: 94 mg/dL (ref 70–99)
POTASSIUM: 3.7 meq/L (ref 3.5–5.1)
SODIUM: 140 meq/L (ref 135–145)

## 2014-09-23 LAB — TSH: TSH: 2.15 u[IU]/mL (ref 0.35–4.50)

## 2014-09-30 ENCOUNTER — Encounter: Payer: Self-pay | Admitting: Internal Medicine

## 2014-09-30 ENCOUNTER — Ambulatory Visit (INDEPENDENT_AMBULATORY_CARE_PROVIDER_SITE_OTHER): Payer: Medicare Other | Admitting: Internal Medicine

## 2014-09-30 VITALS — BP 130/70 | HR 73 | Temp 98.4°F | Resp 18 | Ht 61.0 in | Wt 139.0 lb

## 2014-09-30 DIAGNOSIS — Z86010 Personal history of colonic polyps: Secondary | ICD-10-CM | POA: Diagnosis not present

## 2014-09-30 DIAGNOSIS — I480 Paroxysmal atrial fibrillation: Secondary | ICD-10-CM

## 2014-09-30 DIAGNOSIS — Z0000 Encounter for general adult medical examination without abnormal findings: Secondary | ICD-10-CM

## 2014-09-30 NOTE — Progress Notes (Signed)
Subjective:    Patient ID: Sydney Lucas, female    DOB: 02-15-1940, 74 y.o.   MRN: [id number]  HPI Patient ID: Sydney Lucas, female   DOB: [birth year], 74 y.o.   MRN: [id number]  Subjective:    Patient ID: Sydney Lucas, female    DOB: 10-Sep-1940, 74 y.o.   MRN: [id number]  HPI 60  ear-old patient who is seen today for a preventative health examination.  She enjoys excellent health. She takes no chronic medications. She has been followed by GYN and does take calcium and vitamin D supplements. She is quite active physically. 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  Approximately 3 years ago she presented with paroxysmal atrial fibrillation in the setting of an acute diarrheal illness. She was seen the following day and had converted to a normal sinus rhythm. Over the past 3 years she has had no further palpitations. She remains on baby aspirin 81 mg daily.  Colonoscopy 2013 - This revealed colonic polyps.  Follow-up colonoscopy 2016.  No further screening colonoscopies recommended  Past medical history is fairly unremarkable. She is a gravida 1 para we'll want abortus 0 No hospitalizations. She takes no chronic medications Surgical history none except for outpatient hand surgery  Social history nonsmoker  Family history mother is 79 history of type 2 diabetes father died 40 history of heart failure and bladder cancer one sister age 41 is in good health  Past Medical History  Diagnosis Date  . Headache(784.0)    No past surgical history on file.  reports that she quit smoking about 46 years ago. She has never used smokeless tobacco. She reports that she does not drink alcohol or use illicit drugs. family history is not on file. Allergies  Allergen Reactions  . Penicillins     REACTION: rash     1. Risk factors, based on past  M,S,F history-  No cardiovascular risk factors  2.  Physical activities: remains  quite active. Exercises regularly at home. No exercise limitations.  3.  Depression/mood: no history of depression or mood disorder  4.  Hearing: no hearing deficits  5.  ADL's: independent in all aspects of daily living  6.  Fall risk: low  7.  Home safety: no problems identified  8.  Height weight, and visual acuity; height and weight stable. No change in visual acuity 9.  Counseling: heart healthy diet encouraged screening colonoscopy encouraged  10. Lab orders based on risk factors: laboratory profile including lipid panel and TSH reviewed 11. Referral-  We'll refer her for colonoscopy 12. Care plan:   Heart healthy diet regular exercise all encouraged we'll continue calcium and vitamin D supplements.  13. Cognitive assessment:  Lower down it with normal affect no history of cognitive dysfunction\ 14.  Annual eye examinations and preventative health examinations.  Encouraged with screening lab.  Further screening colonoscopies no longer recommended.  Last colonoscopy earlier this year  69.  Provider list includes primary care medicine and ophthalmology     Review of Systems  Constitutional: Negative for fever, appetite change, fatigue and unexpected weight change.  HENT: Negative for hearing loss, ear pain, nosebleeds, congestion, sore throat, mouth sores, trouble swallowing, neck stiffness, dental problem, voice change, sinus pressure and tinnitus.   Eyes: Negative for photophobia, pain, redness and visual disturbance.  Respiratory: Negative for cough, chest tightness and shortness of breath.   Cardiovascular: Positive for palpitations (single episode of palpitations lasting 4-5 minutes). Negative  for chest pain and leg swelling.  Gastrointestinal: Negative for nausea, vomiting, abdominal pain, diarrhea, constipation, blood in stool, abdominal distention and rectal pain.  Genitourinary: Negative for dysuria, urgency, frequency, hematuria, flank pain, vaginal bleeding, vaginal  discharge, difficulty urinating, genital sores, vaginal pain, menstrual problem and pelvic pain.  Musculoskeletal: Negative for back pain and arthralgias.  Skin: Negative for rash.  Neurological: Negative for dizziness, syncope, speech difficulty, weakness, light-headedness, numbness and headaches.  Hematological: Negative for adenopathy. Does not bruise/bleed easily.  Psychiatric/Behavioral: Negative for suicidal ideas, behavioral problems, self-injury, dysphoric mood and agitation. The patient is not nervous/anxious.        Objective:   Physical Exam  Constitutional: She is oriented to person, place, and time. She appears well-developed and well-nourished.  HENT:  Head: Normocephalic and atraumatic.  Right Ear: External ear normal.  Left Ear: External ear normal.  Mouth/Throat: Oropharynx is clear and moist.  Eyes: Conjunctivae and EOM are normal.  Neck: Normal range of motion. Neck supple. No JVD present. No thyromegaly present.  Cardiovascular: Normal rate, regular rhythm, normal heart sounds and intact distal pulses.   No murmur heard. Pulmonary/Chest: Effort normal and breath sounds normal. She has no wheezes. She has no rales.  Abdominal: Soft. Bowel sounds are normal. She exhibits no distension and no mass. There is no tenderness. There is no rebound and no guarding.  Musculoskeletal: Normal range of motion. She exhibits no edema and no tenderness.  Neurological: She is alert and oriented to person, place, and time. She has normal reflexes. No cranial nerve deficit. She exhibits normal muscle tone. Coordination normal.  Skin: Skin is warm and dry. No rash noted.  Psychiatric: She has a normal mood and affect. Her behavior is normal.          Assessment & Plan:  preventive health examination   We'll continue calcium and vitamin D supplements regular exercise encouraged heart healthy diet encouraged a screening colonoscopy will be scheduled  Paroxysmal atrial fibrillation.   Chads score now 1 based on age of 29.  However, patient has had no atrial fibrillation for 3 years.  Will continue baby aspirin only  History colonic polyps.  Follow-up colonoscopy earlier this year   Review of Systems as above    Objective:   Physical Exam   as above      Assessment & Plan:    As above Laboratory studies reviewed, and these were unremarkable  Recheck in one year or as needed Annual flu vaccine.  Strongly recommended  The patient declines all other vaccines

## 2014-09-30 NOTE — Patient Instructions (Signed)
Limit your sodium (Salt) intake    It is important that you exercise regularly, at least 20 minutes 3 to 4 times per week.  If you develop chest pain or shortness of breath seek  medical attention.  Take a calcium supplement, plus [phone] units of vitamin D  Please see your eye doctor yearly   Return in one year for follow-up  Health Maintenance Adopting a healthy lifestyle and getting preventive care can go a long way to promote health and wellness. Talk with your health care provider about what schedule of regular examinations is right for you. This is a good chance for you to check in with your provider about disease prevention and staying healthy. In between checkups, there are plenty of things you can do on your own. Experts have done a lot of research about which lifestyle changes and preventive measures are most likely to keep you healthy. Ask your health care provider for more information. WEIGHT AND DIET  Eat a healthy diet  Be sure to include plenty of vegetables, fruits, low-fat dairy products, and lean protein.  Do not eat a lot of foods high in solid fats, added sugars, or salt.  Get regular exercise. This is one of the most important things you can do for your health.  Most adults should exercise for at least 150 minutes each week. The exercise should increase your heart rate and make you sweat (moderate-intensity exercise).  Most adults should also do strengthening exercises at least twice a week. This is in addition to the moderate-intensity exercise.  Maintain a healthy weight  Body mass index (BMI) is a measurement that can be used to identify possible weight problems. It estimates body fat based on height and weight. Your health care provider can help determine your BMI and help you achieve or maintain a healthy weight.  For females 10 years of age and older:   A BMI below 18.5 is considered underweight.  A BMI of 18.5 to 24.9 is normal.  A BMI of 25 to 29.9  is considered overweight.  A BMI of 30 and above is considered obese.  Watch levels of cholesterol and blood lipids  You should start having your blood tested for lipids and cholesterol at 76 years of age, then have this test every 5 years.  You may need to have your cholesterol levels checked more often if:  Your lipid or cholesterol levels are high.  You are older than 76 years of age.  You are at high risk for heart disease.  CANCER SCREENING   Lung Cancer  Lung cancer screening is recommended for adults 50-30 years old who are at high risk for lung cancer because of a history of smoking.  A yearly low-dose CT scan of the lungs is recommended for people who:  Currently smoke.  Have quit within the past 15 years.  Have at least a 30-pack-year history of smoking. A pack year is smoking an average of one pack of cigarettes a day for 1 year.  Yearly screening should continue until it has been 15 years since you quit.  Yearly screening should stop if you develop a health problem that would prevent you from having lung cancer treatment.  Breast Cancer  Practice breast self-awareness. This means understanding how your breasts normally appear and feel.  It also means doing regular breast self-exams. Let your health care provider know about any changes, no matter how small.  If you are in your 20s or 30s, you  should have a clinical breast exam (CBE) by a health care provider every 1-3 years as part of a regular health exam.  If you are 40 or older, have a CBE every year. Also consider having a breast X-ray (mammogram) every year.  If you have a family history of breast cancer, talk to your health care provider about genetic screening.  If you are at high risk for breast cancer, talk to your health care provider about having an MRI and a mammogram every year.  Breast cancer gene (BRCA) assessment is recommended for women who have family members with BRCA-related cancers.  BRCA-related cancers include:  Breast.  Ovarian.  Tubal.  Peritoneal cancers.  Results of the assessment will determine the need for genetic counseling and BRCA1 and BRCA2 testing. Cervical Cancer Routine pelvic examinations to screen for cervical cancer are no longer recommended for nonpregnant women who are considered low risk for cancer of the pelvic organs (ovaries, uterus, and vagina) and who do not have symptoms. A pelvic examination may be necessary if you have symptoms including those associated with pelvic infections. Ask your health care provider if a screening pelvic exam is right for you.   The Pap test is the screening test for cervical cancer for women who are considered at risk.  If you had a hysterectomy for a problem that was not cancer or a condition that could lead to cancer, then you no longer need Pap tests.  If you are older than 65 years, and you have had normal Pap tests for the past 10 years, you no longer need to have Pap tests.  If you have had past treatment for cervical cancer or a condition that could lead to cancer, you need Pap tests and screening for cancer for at least 20 years after your treatment.  If you no longer get a Pap test, assess your risk factors if they change (such as having a new sexual partner). This can affect whether you should start being screened again.  Some women have medical problems that increase their chance of getting cervical cancer. If this is the case for you, your health care provider may recommend more frequent screening and Pap tests.  The human papillomavirus (HPV) test is another test that may be used for cervical cancer screening. The HPV test looks for the virus that can cause cell changes in the cervix. The cells collected during the Pap test can be tested for HPV.  The HPV test can be used to screen women 59 years of age and older. Getting tested for HPV can extend the interval between normal Pap tests from three to  five years.  An HPV test also should be used to screen women of any age who have unclear Pap test results.  After 76 years of age, women should have HPV testing as often as Pap tests.  Colorectal Cancer  This type of cancer can be detected and often prevented.  Routine colorectal cancer screening usually begins at 76 years of age and continues through 76 years of age.  Your health care provider may recommend screening at an earlier age if you have risk factors for colon cancer.  Your health care provider may also recommend using home test kits to check for hidden blood in the stool.  A small camera at the end of a tube can be used to examine your colon directly (sigmoidoscopy or colonoscopy). This is done to check for the earliest forms of colorectal cancer.  Routine screening  usually begins at age 24.  Direct examination of the colon should be repeated every 5-10 years through 76 years of age. However, you may need to be screened more often if early forms of precancerous polyps or small growths are found. Skin Cancer  Check your skin from head to toe regularly.  Tell your health care provider about any new moles or changes in moles, especially if there is a change in a mole's shape or color.  Also tell your health care provider if you have a mole that is larger than the size of a pencil eraser.  Always use sunscreen. Apply sunscreen liberally and repeatedly throughout the day.  Protect yourself by wearing long sleeves, pants, a wide-brimmed hat, and sunglasses whenever you are outside. HEART DISEASE, DIABETES, AND HIGH BLOOD PRESSURE   Have your blood pressure checked at least every 1-2 years. High blood pressure causes heart disease and increases the risk of stroke.  If you are between 86 years and 36 years old, ask your health care provider if you should take aspirin to prevent strokes.  Have regular diabetes screenings. This involves taking a blood sample to check your  fasting blood sugar level.  If you are at a normal weight and have a low risk for diabetes, have this test once every three years after 76 years of age.  If you are overweight and have a high risk for diabetes, consider being tested at a younger age or more often. PREVENTING INFECTION  Hepatitis B  If you have a higher risk for hepatitis B, you should be screened for this virus. You are considered at high risk for hepatitis B if:  You were born in a country where hepatitis B is common. Ask your health care provider which countries are considered high risk.  Your parents were born in a high-risk country, and you have not been immunized against hepatitis B (hepatitis B vaccine).  You have HIV or AIDS.  You use needles to inject street drugs.  You live with someone who has hepatitis B.  You have had sex with someone who has hepatitis B.  You get hemodialysis treatment.  You take certain medicines for conditions, including cancer, organ transplantation, and autoimmune conditions. Hepatitis C  Blood testing is recommended for:  Everyone born from [birth year] through 1967.  Anyone with known risk factors for hepatitis C. Sexually transmitted infections (STIs)  You should be screened for sexually transmitted infections (STIs) including gonorrhea and chlamydia if:  You are sexually active and are younger than 76 years of age.  You are older than 76 years of age and your health care provider tells you that you are at risk for this type of infection.  Your sexual activity has changed since you were last screened and you are at an increased risk for chlamydia or gonorrhea. Ask your health care provider if you are at risk.  If you do not have HIV, but are at risk, it may be recommended that you take a prescription medicine daily to prevent HIV infection. This is called pre-exposure prophylaxis (PrEP). You are considered at risk if:  You are sexually active and do not regularly use condoms or  know the HIV status of your partner(s).  You take drugs by injection.  You are sexually active with a partner who has HIV. Talk with your health care provider about whether you are at high risk of being infected with HIV. If you choose to begin PrEP, you should first be tested  for HIV. You should then be tested every 3 months for as long as you are taking PrEP.  PREGNANCY   If you are premenopausal and you may become pregnant, ask your health care provider about preconception counseling.  If you may become pregnant, take 400 to 800 micrograms (mcg) of folic acid every day.  If you want to prevent pregnancy, talk to your health care provider about birth control (contraception). OSTEOPOROSIS AND MENOPAUSE   Osteoporosis is a disease in which the bones lose minerals and strength with aging. This can result in serious bone fractures. Your risk for osteoporosis can be identified using a bone density scan.  If you are 46 years of age or older, or if you are at risk for osteoporosis and fractures, ask your health care provider if you should be screened.  Ask your health care provider whether you should take a calcium or vitamin D supplement to lower your risk for osteoporosis.  Menopause may have certain physical symptoms and risks.  Hormone replacement therapy may reduce some of these symptoms and risks. Talk to your health care provider about whether hormone replacement therapy is right for you.  HOME CARE INSTRUCTIONS   Schedule regular health, dental, and eye exams.  Stay current with your immunizations.   Do not use any tobacco products including cigarettes, chewing tobacco, or electronic cigarettes.  If you are pregnant, do not drink alcohol.  If you are breastfeeding, limit how much and how often you drink alcohol.  Limit alcohol intake to no more than 1 drink per day for nonpregnant women. One drink equals 12 ounces of beer, 5 ounces of wine, or 1 ounces of hard liquor.  Do  not use street drugs.  Do not share needles.  Ask your health care provider for help if you need support or information about quitting drugs.  Tell your health care provider if you often feel depressed.  Tell your health care provider if you have ever been abused or do not feel safe at home. Document Released: 08/09/2010 Document Revised: 06/10/2013 Document Reviewed: 12/26/2012 [facility] Patient Information 2015 [place], [place]. This information is not intended to replace advice given to you by your health care provider. Make sure you discuss any questions you have with your health care provider.

## 2014-09-30 NOTE — Progress Notes (Signed)
Pre visit review using our clinic review tool, if applicable. No additional management support is needed unless otherwise documented below in the visit note. 

## 2014-11-20 DIAGNOSIS — H52223 Regular astigmatism, bilateral: Secondary | ICD-10-CM | POA: Diagnosis not present

## 2014-11-20 DIAGNOSIS — H5203 Hypermetropia, bilateral: Secondary | ICD-10-CM | POA: Diagnosis not present

## 2014-11-20 DIAGNOSIS — H524 Presbyopia: Secondary | ICD-10-CM | POA: Diagnosis not present

## 2015-06-23 DIAGNOSIS — H33192 Other retinoschisis and retinal cysts, left eye: Secondary | ICD-10-CM | POA: Diagnosis not present

## 2015-06-23 DIAGNOSIS — H25813 Combined forms of age-related cataract, bilateral: Secondary | ICD-10-CM | POA: Diagnosis not present

## 2015-06-23 DIAGNOSIS — H18413 Arcus senilis, bilateral: Secondary | ICD-10-CM | POA: Diagnosis not present

## 2015-12-18 ENCOUNTER — Other Ambulatory Visit: Payer: Medicare Other

## 2015-12-25 ENCOUNTER — Encounter: Payer: Medicare Other | Admitting: Internal Medicine

## 2015-12-29 ENCOUNTER — Other Ambulatory Visit (INDEPENDENT_AMBULATORY_CARE_PROVIDER_SITE_OTHER): Payer: Medicare Other

## 2015-12-29 DIAGNOSIS — Z0000 Encounter for general adult medical examination without abnormal findings: Secondary | ICD-10-CM

## 2015-12-29 LAB — POC URINALSYSI DIPSTICK (AUTOMATED)
Bilirubin, UA: NEGATIVE
GLUCOSE UA: NEGATIVE
KETONES UA: NEGATIVE
Nitrite, UA: NEGATIVE
Protein, UA: NEGATIVE
SPEC GRAV UA: 1.015
UROBILINOGEN UA: 0.2
pH, UA: 7

## 2015-12-29 LAB — CBC WITH DIFFERENTIAL/PLATELET
BASOS ABS: 0.1 10*3/uL (ref 0.0–0.1)
Basophils Relative: 1.6 % (ref 0.0–3.0)
EOS PCT: 3.5 % (ref 0.0–5.0)
Eosinophils Absolute: 0.2 10*3/uL (ref 0.0–0.7)
HEMATOCRIT: 36 % (ref 36.0–46.0)
HEMOGLOBIN: 12.3 g/dL (ref 12.0–15.0)
LYMPHS PCT: 38.4 % (ref 12.0–46.0)
Lymphs Abs: 2.6 10*3/uL (ref 0.7–4.0)
MCHC: 34.2 g/dL (ref 30.0–36.0)
MCV: 89.8 fl (ref 78.0–100.0)
MONOS PCT: 10.1 % (ref 3.0–12.0)
Monocytes Absolute: 0.7 10*3/uL (ref 0.1–1.0)
NEUTROS PCT: 46.4 % (ref 43.0–77.0)
Neutro Abs: 3.2 10*3/uL (ref 1.4–7.7)
Platelets: 280 10*3/uL (ref 150.0–400.0)
RBC: 4.01 Mil/uL (ref 3.87–5.11)
RDW: 13 % (ref 11.5–15.5)
WBC: 6.8 10*3/uL (ref 4.0–10.5)

## 2015-12-29 LAB — TSH: TSH: 1.53 u[IU]/mL (ref 0.35–4.50)

## 2015-12-29 LAB — LIPID PANEL
CHOLESTEROL: 183 mg/dL (ref 0–200)
HDL: 52.8 mg/dL (ref >39.00)
LDL Cholesterol: 101 mg/dL — ABNORMAL HIGH (ref 0–99)
NonHDL: 130.57
Total CHOL/HDL Ratio: 3
Triglycerides: 148 mg/dL (ref 0.0–149.0)
VLDL: 29.6 mg/dL (ref 0.0–40.0)

## 2015-12-29 LAB — BASIC METABOLIC PANEL
BUN: 12 mg/dL (ref 6–23)
CALCIUM: 9.6 mg/dL (ref 8.4–10.5)
CO2: 29 mEq/L (ref 19–32)
Chloride: 105 mEq/L (ref 96–112)
Creatinine, Ser: 0.73 mg/dL (ref 0.40–1.20)
GFR: 82.16 mL/min (ref >60.00)
GLUCOSE: 89 mg/dL (ref 70–99)
POTASSIUM: 4 meq/L (ref 3.5–5.1)
SODIUM: 141 meq/L (ref 135–145)

## 2015-12-29 LAB — HEPATIC FUNCTION PANEL
ALBUMIN: 4 g/dL (ref 3.5–5.2)
ALK PHOS: 81 U/L (ref 39–117)
ALT: 19 U/L (ref 0–35)
AST: 22 U/L (ref 0–37)
Bilirubin, Direct: 0.1 mg/dL (ref 0.0–0.3)
TOTAL PROTEIN: 7.2 g/dL (ref 6.0–8.3)
Total Bilirubin: 0.8 mg/dL (ref 0.2–1.2)

## 2016-01-06 ENCOUNTER — Encounter: Payer: Self-pay | Admitting: Internal Medicine

## 2016-01-06 ENCOUNTER — Ambulatory Visit (INDEPENDENT_AMBULATORY_CARE_PROVIDER_SITE_OTHER): Payer: Medicare Other | Admitting: Internal Medicine

## 2016-01-06 VITALS — BP 140/78 | HR 69 | Temp 98.1°F | Resp 16 | Ht 61.0 in | Wt 140.6 lb

## 2016-01-06 DIAGNOSIS — Z0000 Encounter for general adult medical examination without abnormal findings: Secondary | ICD-10-CM | POA: Diagnosis not present

## 2016-01-06 NOTE — Progress Notes (Signed)
Pre visit review using our clinic review tool, if applicable. No additional management support is needed unless otherwise documented below in the visit note. 

## 2016-01-06 NOTE — Patient Instructions (Addendum)
Schedule your mammogram.  Take a calcium supplement, plus [phone] units of vitamin D   Health Maintenance for Postmenopausal Women Introduction Menopause is a normal process in which your reproductive ability comes to an end. This process happens gradually over a span of months to years, usually between the ages of 31 and 67. Menopause is complete when you have missed 12 consecutive menstrual periods. It is important to talk with your health care provider about some of the most common conditions that affect postmenopausal women, such as heart disease, cancer, and bone loss (osteoporosis). Adopting a healthy lifestyle and getting preventive care can help to promote your health and wellness. Those actions can also lower your chances of developing some of these common conditions. What should I know about menopause? During menopause, you may experience a number of symptoms, such as:  Moderate-to-severe hot flashes.  Night sweats.  Decrease in sex drive.  Mood swings.  Headaches.  Tiredness.  Irritability.  Memory problems.  Insomnia. Choosing to treat or not to treat menopausal changes is an individual decision that you make with your health care provider. What should I know about hormone replacement therapy and supplements? Hormone therapy products are effective for treating symptoms that are associated with menopause, such as hot flashes and night sweats. Hormone replacement carries certain risks, especially as you become older. If you are thinking about using estrogen or estrogen with progestin treatments, discuss the benefits and risks with your health care provider. What should I know about heart disease and stroke? Heart disease, heart attack, and stroke become more likely as you age. This may be due, in part, to the hormonal changes that your body experiences during menopause. These can affect how your body processes dietary fats, triglycerides, and cholesterol. Heart attack and  stroke are both medical emergencies. There are many things that you can do to help prevent heart disease and stroke:  Have your blood pressure checked at least every 1-2 years. High blood pressure causes heart disease and increases the risk of stroke.  If you are 77-77 years old, ask your health care provider if you should take aspirin to prevent a heart attack or a stroke.  Do not use any tobacco products, including cigarettes, chewing tobacco, or electronic cigarettes. If you need help quitting, ask your health care provider.  It is important to eat a healthy diet and maintain a healthy weight.  Be sure to include plenty of vegetables, fruits, low-fat dairy products, and lean protein.  Avoid eating foods that are high in solid fats, added sugars, or salt (sodium).  Get regular exercise. This is one of the most important things that you can do for your health.  Try to exercise for at least 150 minutes each week. The type of exercise that you do should increase your heart rate and make you sweat. This is known as moderate-intensity exercise.  Try to do strengthening exercises at least twice each week. Do these in addition to the moderate-intensity exercise.  Know your numbers.Ask your health care provider to check your cholesterol and your blood glucose. Continue to have your blood tested as directed by your health care provider. What should I know about cancer screening? There are several types of cancer. Take the following steps to reduce your risk and to catch any cancer development as early as possible. Breast Cancer  Practice breast self-awareness.  This means understanding how your breasts normally appear and feel.  It also means doing regular breast self-exams. Let your health  care provider know about any changes, no matter how small.  If you are 77 or older, have a clinician do a breast exam (clinical breast exam or CBE) every year. Depending on your age, family history, and  medical history, it may be recommended that you also have a yearly breast X-ray (mammogram).  If you have a family history of breast cancer, talk with your health care provider about genetic screening.  If you are at high risk for breast cancer, talk with your health care provider about having an MRI and a mammogram every year.  Breast cancer (BRCA) gene test is recommended for women who have family members with BRCA-related cancers. Results of the assessment will determine the need for genetic counseling and BRCA1 and for BRCA2 testing. BRCA-related cancers include these types:  Breast. This occurs in males or females.  Ovarian.  Tubal. This may also be called fallopian tube cancer.  Cancer of the abdominal or pelvic lining (peritoneal cancer).  Prostate.  Pancreatic. Cervical, Uterine, and Ovarian Cancer  Your health care provider may recommend that you be screened regularly for cancer of the pelvic organs. These include your ovaries, uterus, and vagina. This screening involves a pelvic exam, which includes checking for microscopic changes to the surface of your cervix (Pap test).  For women ages 21-65, health care providers may recommend a pelvic exam and a Pap test every three years. For women ages 19-65, they may recommend the Pap test and pelvic exam, combined with testing for human papilloma virus (HPV), every five years. Some types of HPV increase your risk of cervical cancer. Testing for HPV may also be done on women of any age who have unclear Pap test results.  Other health care providers may not recommend any screening for nonpregnant women who are considered low risk for pelvic cancer and have no symptoms. Ask your health care provider if a screening pelvic exam is right for you.  If you have had past treatment for cervical cancer or a condition that could lead to cancer, you need Pap tests and screening for cancer for at least 20 years after your treatment. If Pap tests have  been discontinued for you, your risk factors (such as having a new sexual partner) need to be reassessed to determine if you should start having screenings again. Some women have medical problems that increase the chance of getting cervical cancer. In these cases, your health care provider may recommend that you have screening and Pap tests more often.  If you have a family history of uterine cancer or ovarian cancer, talk with your health care provider about genetic screening.  If you have vaginal bleeding after reaching menopause, tell your health care provider.  There are currently no reliable tests available to screen for ovarian cancer. Lung Cancer  Lung cancer screening is recommended for adults 22-68 years old who are at high risk for lung cancer because of a history of smoking. A yearly low-dose CT scan of the lungs is recommended if you:  Currently smoke.  Have a history of at least 30 pack-years of smoking and you currently smoke or have quit within the past 15 years. A pack-year is smoking an average of one pack of cigarettes per day for one year. Yearly screening should:  Continue until it has been 15 years since you quit.  Stop if you develop a health problem that would prevent you from having lung cancer treatment. Colorectal Cancer  This type of cancer can be detected  and can often be prevented.  Routine colorectal cancer screening usually begins at age 32 and continues through age 75.  If you have risk factors for colon cancer, your health care provider may recommend that you be screened at an earlier age.  If you have a family history of colorectal cancer, talk with your health care provider about genetic screening.  Your health care provider may also recommend using home test kits to check for hidden blood in your stool.  A small camera at the end of a tube can be used to examine your colon directly (sigmoidoscopy or colonoscopy). This is done to check for the earliest  forms of colorectal cancer.  Direct examination of the colon should be repeated every 5-10 years until age 67. However, if early forms of precancerous polyps or small growths are found or if you have a family history or genetic risk for colorectal cancer, you may need to be screened more often. Skin Cancer  Check your skin from head to toe regularly.  Monitor any moles. Be sure to tell your health care provider:  About any new moles or changes in moles, especially if there is a change in a mole's shape or color.  If you have a mole that is larger than the size of a pencil eraser.  If any of your family members has a history of skin cancer, especially at a young age, talk with your health care provider about genetic screening.  Always use sunscreen. Apply sunscreen liberally and repeatedly throughout the day.  Whenever you are outside, protect yourself by wearing long sleeves, pants, a wide-brimmed hat, and sunglasses. What should I know about osteoporosis? Osteoporosis is a condition in which bone destruction happens more quickly than new bone creation. After menopause, you may be at an increased risk for osteoporosis. To help prevent osteoporosis or the bone fractures that can happen because of osteoporosis, the following is recommended:  If you are 82-68 years old, get at least 1,000 mg of calcium and at least 600 mg of vitamin D per day.  If you are older than age 68 but younger than age 59, get at least 1,200 mg of calcium and at least 600 mg of vitamin D per day.  If you are older than age 31, get at least 1,200 mg of calcium and at least 800 mg of vitamin D per day. Smoking and excessive alcohol intake increase the risk of osteoporosis. Eat foods that are rich in calcium and vitamin D, and do weight-bearing exercises several times each week as directed by your health care provider. What should I know about how menopause affects my mental health? Depression may occur at any age, but  it is more common as you become older. Common symptoms of depression include:  Low or sad mood.  Changes in sleep patterns.  Changes in appetite or eating patterns.  Feeling an overall lack of motivation or enjoyment of activities that you previously enjoyed.  Frequent crying spells. Talk with your health care provider if you think that you are experiencing depression. What should I know about immunizations? It is important that you get and maintain your immunizations. These include:  Tetanus, diphtheria, and pertussis (Tdap) booster vaccine.  Influenza every year before the flu season begins.  Pneumonia vaccine.  Shingles vaccine. Your health care provider may also recommend other immunizations. This information is not intended to replace advice given to you by your health care provider. Make sure you discuss any questions you have  with your health care provider. Document Released: 03/18/2005 Document Revised: 08/14/2015 Document Reviewed: 10/28/2014  2017 [employer]

## 2016-01-07 ENCOUNTER — Encounter: Payer: Self-pay | Admitting: Internal Medicine

## 2016-01-07 NOTE — Progress Notes (Signed)
Subjective:    Patient ID: Sydney Lucas, female    DOB: 02-01-1941, 75 y.o.   MRN: [id number]  HPI  75 year old patient who is seen today for a health maintenance examination. She enjoys remarkably good health and takes no chronic medication other than some supplements She has remote history of atrial fibrillation a number of years ago that occurred very briefly in the setting of an acute diarrheal illness She does have a history of colonic polyps and her last colonoscopy was 2013  Past Medical History:  Diagnosis Date  . Headache(784.0)      Social History   Social History  . Marital status: Married    Spouse name: N/A  . Number of children: N/A  . Years of education: N/A   Occupational History  . Not on file.   Social History Main Topics  . Smoking status: Former Smoker    Quit date: 02/08/1968  . Smokeless tobacco: Never Used  . Alcohol use No  . Drug use: No  . Sexual activity: Not on file   Other Topics Concern  . Not on file   Social History Narrative  . No narrative on file    No past surgical history on file.  No family history on file.  Allergies  Allergen Reactions  . Penicillins     REACTION: rash    Current Outpatient Prescriptions on File Prior to Visit  Medication Sig Dispense Refill  . Biotin 1000 MCG tablet Take 1,000 mcg by mouth daily.      . Calcium Carbonate-Vit D-Min (CALTRATE 600+D PLUS) 600-400 MG-UNIT per tablet Take 1 tablet by mouth daily.      Marland Kitchen ibuprofen (ADVIL,MOTRIN) 200 MG tablet Take 400 mg by mouth every 8 (eight) hours as needed.    . Magnesium 250 MG TABS Take by mouth daily.      . Multiple Vitamin (MULTIVITAMIN) tablet Take 1 tablet by mouth daily.      . vitamin B-12 (CYANOCOBALAMIN) 100 MCG tablet Take 50 mcg by mouth daily.      . vitamin C (ASCORBIC ACID) 500 MG tablet Take 500 mg by mouth daily.     No current facility-administered medications on file prior to visit.     BP 140/78 (BP Location:  Left Arm, Patient Position: Sitting, Cuff Size: Normal)   Pulse 69   Temp 98.1 F (36.7 C) (Oral)   Resp 16   Ht 5\' 1"  (1.549 m)   Wt 140 lb 9.6 oz (63.8 kg)   SpO2 97%   BMI 26.57 kg/m   Medicare wellness  1. Risk factors, based on past  M,S,F history- no cardiovascular risk factors except age  51.  Physical activities:remains quite active without exercise limitations.  She walks every day does use a stationary bike as well as weight training at her health club  3.  Depression/mood:no history of major depression or mood disorder  4.  Hearing:mild deficit.  States she has an occasional difficult time hearing her preacher at church  5.  ADL's:independent  6.  Fall risk:low  7.  Home safety:no problems identified  8.  Height weight, and visual acuity;height and weight stable no change in visual acuity  9.  Counseling:continue heart healthy diet and regular exercise regimen  10. Lab orders based on risk factors:laboratory profile including lipid panel reviewed  11. Referral :not appropriate at this time  12. Care plan:follow colonoscopy considered in one year  74. Cognitive assessment: alert and oriented with normal  affect no cognitive dysfunction  14. Screening: Patient provided with a written and personalized 5-10 year screening schedule in the AVS.    15. Provider List Update: primary care ophthalmology GI and OB/GYN    Review of Systems  Constitutional: Negative.   HENT: Negative for congestion, dental problem, hearing loss, rhinorrhea, sinus pressure, sore throat and tinnitus.   Eyes: Negative for pain, discharge and visual disturbance.  Respiratory: Negative for cough and shortness of breath.   Cardiovascular: Negative for chest pain, palpitations and leg swelling.  Gastrointestinal: Negative for abdominal distention, abdominal pain, blood in stool, constipation, diarrhea, nausea and vomiting.  Genitourinary: Negative for difficulty urinating, dysuria, flank  pain, frequency, hematuria, pelvic pain, urgency, vaginal bleeding, vaginal discharge and vaginal pain.  Musculoskeletal: Negative for arthralgias, gait problem and joint swelling.  Skin: Negative for rash.  Neurological: Negative for dizziness, syncope, speech difficulty, weakness, numbness and headaches.  Hematological: Negative for adenopathy.  Psychiatric/Behavioral: Negative for agitation, behavioral problems and dysphoric mood. The patient is not nervous/anxious.        Objective:   Physical Exam  Constitutional: She is oriented to person, place, and time. She appears well-developed and well-nourished.  HENT:  Head: Normocephalic and atraumatic.  Right Ear: External ear normal.  Left Ear: External ear normal.  Mouth/Throat: Oropharynx is clear and moist.  Mild pharyngeal crowding  Eyes: Conjunctivae and EOM are normal.  Neck: Normal range of motion. Neck supple. No JVD present. No thyromegaly present.  Cardiovascular: Normal rate, regular rhythm, normal heart sounds and intact distal pulses.   No murmur heard. Pulmonary/Chest: Effort normal and breath sounds normal. She has no wheezes. She has no rales.  Abdominal: Soft. Bowel sounds are normal. She exhibits no distension and no mass. There is no tenderness. There is no rebound and no guarding.  Musculoskeletal: Normal range of motion. She exhibits no edema or tenderness.  Neurological: She is alert and oriented to person, place, and time. She has normal reflexes. No cranial nerve deficit. She exhibits normal muscle tone. Coordination normal.  Skin: Skin is warm and dry. No rash noted.  Psychiatric: She has a normal mood and affect. Her behavior is normal.          Assessment & Plan:   Preventive health examination Medicare wellness exam  We'll continue active lifestyle and heart healthy diet. Calcium and vitamin D supplementations.  Encouraged Follow-up one year or as needed  Nyoka Cowden

## 2016-05-11 ENCOUNTER — Telehealth: Payer: Self-pay | Admitting: Family Medicine

## 2016-05-11 ENCOUNTER — Encounter: Payer: Self-pay | Admitting: Family Medicine

## 2016-05-11 ENCOUNTER — Ambulatory Visit (INDEPENDENT_AMBULATORY_CARE_PROVIDER_SITE_OTHER): Payer: Medicare Other | Admitting: Family Medicine

## 2016-05-11 VITALS — BP 130/70 | HR 79 | Temp 98.6°F | Wt 141.7 lb

## 2016-05-11 DIAGNOSIS — R002 Palpitations: Secondary | ICD-10-CM | POA: Diagnosis not present

## 2016-05-11 NOTE — Patient Instructions (Signed)
Minimize caffeine use We will set up cardiac event monitor Follow up for any chest pain or other new symptoms.

## 2016-05-11 NOTE — Progress Notes (Signed)
Pre visit review using our clinic review tool, if applicable. No additional management support is needed unless otherwise documented below in the visit note. 

## 2016-05-11 NOTE — Progress Notes (Signed)
Subjective:     Patient ID: Sydney Lucas, female   DOB: Apr 13, 1940, 76 y.o.   MRN: [id number]  HPI Patient is seen as a work and with episode this morning lasting about one and one half hour of sensation of "heart racing". She gets up each morning and walks to feed some feral cats. She had no problems whatsoever with walking. No chest pain. She was getting back to her house when she had about our half episode of tachycardia palpitations. She had some mild pain between her shoulder blade region but no chest pains. No dyspnea on exertion. No cough. No fevers or chills. No pleuritic pain. She drinks about 3-4 cups of coffee per day but only had 1 cup of coffee that time of day. No dizziness or syncope.  She has also had a couple of other, much briefer, episodes of transient rapid palpitations.    She had recent TSH in November which was normal. She had echocardiogram 2013 with normal ejection fraction no major abnormalities. She takes no regular prescription medications. Nonsmoker.  Past Medical History:  Diagnosis Date  . Headache(784.0)    No past surgical history on file.  reports that she quit smoking about 48 years ago. She has never used smokeless tobacco. She reports that she does not drink alcohol or use drugs. family history is not on file. Allergies  Allergen Reactions  . Penicillins     REACTION: rash     Review of Systems  Constitutional: Negative for appetite change, chills, fatigue, fever and unexpected weight change.  Respiratory: Negative for shortness of breath.   Cardiovascular: Positive for palpitations. Negative for chest pain and leg swelling.  Gastrointestinal: Negative for abdominal pain.  Neurological: Negative for syncope.       Objective:   Physical Exam  Constitutional: She is oriented to person, place, and time. She appears well-developed and well-nourished.  Neck: Neck supple. No thyromegaly present.  Cardiovascular: Normal rate and regular  rhythm.  Exam reveals no gallop.   Pulmonary/Chest: Effort normal and breath sounds normal. No respiratory distress. She has no wheezes. She has no rales.  Musculoskeletal: She exhibits no edema.  Neurological: She is alert and oriented to person, place, and time. No cranial nerve deficit.       Assessment:     Patient presents with episode this morning lasting about 90 minutes of subjective rapid palpitations. No associated chest pains or dyspnea.    Plan:     -Check EKG-NSR with no acute changes -gradually reduce caffeine intake. -set up event monitor to further assess -follow up immediately for any chest pain, dizziness, or other new symptoms.  Eulas Post MD [facility]

## 2016-05-11 NOTE — Telephone Encounter (Signed)
I spoke with patient's provider Dr. Raliegh Ip and he recommended that she be seen today. I called pt and she is now on the schedule to see Dr. Elease Hashimoto today, pt denies any type of symptoms at this time, she agreed to come in today and if gets worse will call EMS.

## 2016-05-11 NOTE — Telephone Encounter (Signed)
Pt called in requesting triage. I spoke with patient and she had symptoms this morning around 7 am,  She was walking back indoors from feeding cats and felt like heart was racing and had som back pain in between shoulder blades, lasted about 1 1/2 hour. No other symptoms at all.

## 2016-05-12 ENCOUNTER — Telehealth: Payer: Self-pay | Admitting: *Deleted

## 2016-05-12 NOTE — Telephone Encounter (Signed)
Patient calling to schedule Cardiac Event monitor, thanks.

## 2016-05-23 ENCOUNTER — Ambulatory Visit (INDEPENDENT_AMBULATORY_CARE_PROVIDER_SITE_OTHER): Payer: Medicare Other

## 2016-05-23 DIAGNOSIS — R002 Palpitations: Secondary | ICD-10-CM

## 2016-06-14 ENCOUNTER — Telehealth: Payer: Self-pay | Admitting: Internal Medicine

## 2016-06-14 ENCOUNTER — Encounter: Payer: Self-pay | Admitting: Adult Health

## 2016-06-14 ENCOUNTER — Ambulatory Visit (INDEPENDENT_AMBULATORY_CARE_PROVIDER_SITE_OTHER): Payer: Medicare Other | Admitting: Adult Health

## 2016-06-14 VITALS — BP 142/80 | Temp 97.9°F | Ht 61.0 in | Wt 141.8 lb

## 2016-06-14 DIAGNOSIS — L231 Allergic contact dermatitis due to adhesives: Secondary | ICD-10-CM | POA: Diagnosis not present

## 2016-06-14 NOTE — Telephone Encounter (Signed)
error 

## 2016-06-14 NOTE — Progress Notes (Signed)
Subjective:    Patient ID: Sydney Lucas, female    DOB: [birth year], 76 y.o.   MRN: [id number]  HPI  76 year old female who  has a past medical history of Headache(784.0). She is a patient of Dr. Raliegh Ip who I am seeing today for the first time. She presents to the office today for the acute complaint of allergic rash from her event monitor that she has been wearing for three weeks. She finally removed the device last night due to not being able to tolerate the irritation. Since she has removed the device she has noticed that her rash has improved. She reports itching but no pain, drainage or welts.   She placed triple antibiotic ointment on the area last night    Review of Systems See HPI   Past Medical History:  Diagnosis Date  . Headache(784.0)     Social History   Social History  . Marital status: Married    Spouse name: N/A  . Number of children: N/A  . Years of education: N/A   Occupational History  . Not on file.   Social History Main Topics  . Smoking status: Former Smoker    Quit date: 02/08/1968  . Smokeless tobacco: Never Used  . Alcohol use No  . Drug use: No  . Sexual activity: Not on file   Other Topics Concern  . Not on file   Social History Narrative  . No narrative on file    No past surgical history on file.  No family history on file.  Allergies  Allergen Reactions  . Penicillins     REACTION: rash    Current Outpatient Prescriptions on File Prior to Visit  Medication Sig Dispense Refill  . Biotin 1000 MCG tablet Take 1,000 mcg by mouth daily.      . Calcium Carbonate-Vit D-Min (CALTRATE 600+D PLUS) 600-400 MG-UNIT per tablet Take 1 tablet by mouth daily.      Marland Kitchen ibuprofen (ADVIL,MOTRIN) 200 MG tablet Take 400 mg by mouth every 8 (eight) hours as needed.    . Magnesium 250 MG TABS Take by mouth daily.      . Multiple Vitamin (MULTIVITAMIN) tablet Take 1 tablet by mouth daily.      . vitamin B-12 (CYANOCOBALAMIN) 100 MCG tablet  Take 50 mcg by mouth daily.      . vitamin C (ASCORBIC ACID) 500 MG tablet Take 500 mg by mouth daily.     No current facility-administered medications on file prior to visit.     BP (!) 142/80 (BP Location: Left Arm, Patient Position: Sitting, Cuff Size: Normal)   Temp 97.9 F (36.6 C) (Oral)   Ht 5\' 1"  (1.549 m)   Wt 141 lb 12.8 oz (64.3 kg)   BMI 26.79 kg/m       Objective:   Physical Exam  Constitutional: She is oriented to person, place, and time. She appears well-developed and well-nourished. No distress.  Musculoskeletal: Normal range of motion. She exhibits no edema, tenderness or deformity.  Neurological: She is alert and oriented to person, place, and time. She has normal reflexes. She displays normal reflexes. No cranial nerve deficit. She exhibits normal muscle tone. Coordination normal.  Skin: Skin is warm and dry. Rash noted. She is not diaphoretic.  Red rash noted on left chest wall in the shape of event monitor. No welts noted. No drainage noted.   Psychiatric: She has a normal mood and affect. Her behavior is normal. Judgment and  thought content normal.  Nursing note and vitals reviewed.     Assessment & Plan:  1. Allergic contact dermatitis due to adhesives - Can add OTC hydrocortisone cream to help with symptoms  - Follow up if needed  Dorothyann Peng, NP

## 2016-06-14 NOTE — Telephone Encounter (Signed)
Pt is calling to report a skin irritation due to a patch that was prescribed to monitor her heart.  Pt would like to know if it is ok to leave patch off or make an appointment? Please advise

## 2016-06-14 NOTE — Telephone Encounter (Signed)
Called both numbers and lmom to call the office back to schedule appointment.

## 2016-06-14 NOTE — Telephone Encounter (Signed)
Follow up.

## 2016-06-14 NOTE — Telephone Encounter (Signed)
Pt scheduled with Cory.  

## 2016-07-14 ENCOUNTER — Encounter (HOSPITAL_COMMUNITY): Payer: Self-pay | Admitting: Emergency Medicine

## 2016-07-14 ENCOUNTER — Emergency Department (HOSPITAL_COMMUNITY): Payer: Medicare Other

## 2016-07-14 ENCOUNTER — Emergency Department (HOSPITAL_COMMUNITY)
Admission: EM | Admit: 2016-07-14 | Discharge: 2016-07-14 | Disposition: A | Discharge status: Home / Self Care | Payer: Medicare Other | Attending: Emergency Medicine | Admitting: Emergency Medicine

## 2016-07-14 DIAGNOSIS — Y999 Unspecified external cause status: Secondary | ICD-10-CM | POA: Insufficient documentation

## 2016-07-14 DIAGNOSIS — Y92410 Unspecified street and highway as the place of occurrence of the external cause: Secondary | ICD-10-CM | POA: Diagnosis not present

## 2016-07-14 DIAGNOSIS — Y939 Activity, unspecified: Secondary | ICD-10-CM | POA: Diagnosis not present

## 2016-07-14 DIAGNOSIS — M25512 Pain in left shoulder: Secondary | ICD-10-CM | POA: Insufficient documentation

## 2016-07-14 DIAGNOSIS — M549 Dorsalgia, unspecified: Secondary | ICD-10-CM | POA: Diagnosis not present

## 2016-07-14 DIAGNOSIS — Z79899 Other long term (current) drug therapy: Secondary | ICD-10-CM | POA: Diagnosis not present

## 2016-07-14 DIAGNOSIS — S199XXA Unspecified injury of neck, initial encounter: Secondary | ICD-10-CM | POA: Diagnosis present

## 2016-07-14 DIAGNOSIS — M542 Cervicalgia: Secondary | ICD-10-CM | POA: Diagnosis not present

## 2016-07-14 DIAGNOSIS — Z87891 Personal history of nicotine dependence: Secondary | ICD-10-CM | POA: Insufficient documentation

## 2016-07-14 DIAGNOSIS — S12601A Unspecified nondisplaced fracture of seventh cervical vertebra, initial encounter for closed fracture: Secondary | ICD-10-CM | POA: Insufficient documentation

## 2016-07-14 MED ORDER — IBUPROFEN 200 MG PO TABS
600.0000 mg | ORAL_TABLET | Freq: Once | ORAL | Status: AC
  Administered 2016-07-14: 600 mg via ORAL
  Filled 2016-07-14: qty 3

## 2016-07-14 MED ORDER — HYDROCODONE-ACETAMINOPHEN 5-325 MG PO TABS
1.0000 | ORAL_TABLET | Freq: Once | ORAL | Status: DC
  Filled 2016-07-14: qty 1

## 2016-07-14 NOTE — Discharge Instructions (Signed)
Follow-up with the referred neurosurgery in for further evaluation in the next 24-48 hours.  You can take Tylenol or Ibuprofen as directed for pain.  Keep the collar on at all times until he can see the neurosurgeon. If it is causing a discomfort he can take it off for short periods of time but then return it back on.  Follow-up with her primary care doctor as needed.  Return the emergency Department for any worsening pain, numbness/weakness of the arms or legs, vision changes, difficulty breathing, chest pain or any other worsening or concerning symptoms.
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
## 2016-07-18 ENCOUNTER — Ambulatory Visit (INDEPENDENT_AMBULATORY_CARE_PROVIDER_SITE_OTHER): Payer: Medicare Other | Admitting: Family Medicine

## 2016-07-18 ENCOUNTER — Encounter: Payer: Self-pay | Admitting: Family Medicine

## 2016-07-18 VITALS — BP 150/86 | HR 78 | Temp 98.3°F | Wt 141.0 lb

## 2016-07-18 DIAGNOSIS — S12601D Unspecified nondisplaced fracture of seventh cervical vertebra, subsequent encounter for fracture with routine healing: Secondary | ICD-10-CM
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
## 2016-07-18 NOTE — Patient Instructions (Addendum)
It was a pleasure to see you today.  You will be contact about your referral.  Please let us know if you have not heard back within 1 week about your referral.  You may continue ibuprofen 400 mg which 2 tablets every 6 hours as needed for discomfort while waiting on referral.  It is important to take this medication on a short term basis and only as needed.    NOW OFFER   [facility] FAST TRACK!!!  SAME DAY Appointments for ACUTE CARE  Such as: Sprains, Injuries, cuts, abrasions, rashes, muscle pain, joint pain, back pain Colds, flu, sore throats, headache, allergies, cough, fever  Ear pain, sinus and eye infections Abdominal pain, nausea, vomiting, diarrhea, upset stomach Animal/insect bites  3 Easy Ways to Schedule: Walk-In Scheduling Call in scheduling Mychart Sign-up: [email or web address]

## 2016-08-05 DIAGNOSIS — M542 Cervicalgia: Secondary | ICD-10-CM | POA: Diagnosis not present

## 2016-08-05 DIAGNOSIS — R030 Elevated blood-pressure reading, without diagnosis of hypertension: Secondary | ICD-10-CM | POA: Diagnosis not present

## 2016-08-05 DIAGNOSIS — M545 Low back pain: Secondary | ICD-10-CM | POA: Diagnosis not present

## 2016-08-19 ENCOUNTER — Ambulatory Visit (INDEPENDENT_AMBULATORY_CARE_PROVIDER_SITE_OTHER): Payer: Medicare Other | Admitting: Internal Medicine

## 2016-08-19 ENCOUNTER — Encounter: Payer: Self-pay | Admitting: Internal Medicine

## 2016-08-19 VITALS — BP 128/80 | HR 73 | Temp 98.4°F | Ht 61.0 in | Wt 140.4 lb

## 2016-08-19 DIAGNOSIS — M542 Cervicalgia: Secondary | ICD-10-CM | POA: Diagnosis not present
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
## 2016-08-19 NOTE — Patient Instructions (Signed)
You  may move around, but avoid painful motions and activities.   Call or return to clinic prn if these symptoms worsen or fail to improve as anticipated.  Physical therapy as discussed

## 2016-09-01 ENCOUNTER — Ambulatory Visit: Payer: Medicare Other | Attending: Internal Medicine | Admitting: Physical Therapy

## 2016-09-01 DIAGNOSIS — R293 Abnormal posture: Secondary | ICD-10-CM | POA: Diagnosis not present

## 2016-09-01 DIAGNOSIS — R29898 Other symptoms and signs involving the musculoskeletal system: Secondary | ICD-10-CM | POA: Insufficient documentation

## 2016-09-01 NOTE — Patient Instructions (Addendum)
Posture - Sitting    Sit upright, head facing forward. Try using a roll to support lower back. Keep shoulders relaxed, and avoid rounded back. Keep hips level with knees. Avoid crossing legs for long periods.   Copyright  VHI. All rights reserved.   http://orth.exer.us/945  Healthy Back - Shoulder Roll    Sitting at edge of chair with upright posture:  Roll both shoulders backward 5-10 times.  Repeat 2-3 times per day.   Copyright  VHI. All rights reserved.  Stretch Break - Chest and Shoulder Stretch    Sitting at edge of chair, and find your best, erect posture, draw shoulders back while bringing elbows back and inward. Return to starting position.  Make sure you don't hike your shoulders. Repeat __5__ times, 2-3 times per day (or whenever you feel your posture is rounded).  Copyright  VHI. All rights reserved.
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
## 2016-09-15 ENCOUNTER — Ambulatory Visit: Payer: Medicare Other | Admitting: Rehabilitation

## 2016-09-23 ENCOUNTER — Ambulatory Visit: Payer: Medicare Other | Attending: Internal Medicine | Admitting: Physical Therapy

## 2016-09-23 DIAGNOSIS — R293 Abnormal posture: Secondary | ICD-10-CM | POA: Diagnosis not present

## 2016-09-23 DIAGNOSIS — R29898 Other symptoms and signs involving the musculoskeletal system: Secondary | ICD-10-CM | POA: Insufficient documentation
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
## 2016-09-23 NOTE — Patient Instructions (Addendum)
Scapular Retraction (Standing)    You can stand at doorway, with arms at sides, squeeze your shoulder blades together. Repeat _10__ times per set. Do _1-2___ sets per session. Do __1__ sessions per day.  http://orth.exer.us/945   Copyright  VHI. All rights reserved.  PELVIC TILT: Anterior    Scoot out to edge of mat.  Start in slumped position. Roll pelvis forward to arch back. _10__ reps per set, _1-2__ sets per day   Copyright  VHI. All rights reserved.  CHEST: Doorway, Bilateral - Standing    Standing in doorway, place hands on wall with elbows bent at shoulder height. Lean forward. Hold _10-15__ seconds. __3-5_ reps per set, _1-2__ sets per day  Copyright  VHI. All rights reserved.  Marching      Make sure to pull your belly button towards your spine, holding your stomach in.  Sitting at edge of chair, March in place, alternating lifting legs, 10-15 reps.  You can progress to lifting opposite arm and leg together.  Do _1-2__ times per day.  Copyright  VHI. All rights reserved.  KNEE: Extension, Long Arc Quads - Sitting    Raise leg until knee is straight. _10__ reps per set, __1-2_ sets per day  Copyright  VHI. All rights reserved.

## 2016-09-27 ENCOUNTER — Ambulatory Visit: Payer: Medicare Other | Admitting: Physical Therapy

## 2016-09-28 ENCOUNTER — Ambulatory Visit: Payer: Medicare Other | Admitting: Physical Therapy

## 2016-10-11 ENCOUNTER — Ambulatory Visit: Payer: Medicare Other | Admitting: Physical Therapy

## 2016-10-25 ENCOUNTER — Ambulatory Visit: Payer: Medicare Other | Attending: Internal Medicine | Admitting: Physical Therapy

## 2016-10-25 DIAGNOSIS — R29898 Other symptoms and signs involving the musculoskeletal system: Secondary | ICD-10-CM | POA: Insufficient documentation

## 2016-10-25 DIAGNOSIS — R293 Abnormal posture: Secondary | ICD-10-CM | POA: Diagnosis not present

## 2016-10-25 NOTE — Patient Instructions (Addendum)
CHEST: Doorway, Bilateral - Standing    Standing in doorway, place hands on wall with hands about shoulder height. Lean forward. Hold _10-15__ seconds.  You should feel the stretch along the front of your shoulders.__3-5_ reps per set, _1-2__ sets per dayActive Neck Rotation    AROM, Rotation with Self-Assist    Sit upright at the edge of your chair, one hand on same-side jaw. Turn head slowly to look over shoulder. Increase stretch by gently pushing with hand on jaw. Hold _5__ seconds.  Repeat _5__ times per session. Do __1-2_ sessions per day.  Copyright  VHI. All rights reserved.    Provided handout for supine decompression exercises:  Neck retraction x 5 reps  Shoulder press (down into mat x 5 reps)
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
## 2016-10-27 ENCOUNTER — Encounter: Payer: Self-pay | Admitting: Internal Medicine

## 2016-11-11 DIAGNOSIS — Z1231 Encounter for screening mammogram for malignant neoplasm of breast: Secondary | ICD-10-CM | POA: Diagnosis not present

## 2016-11-11 LAB — HM MAMMOGRAPHY

## 2016-11-23 ENCOUNTER — Encounter: Payer: Self-pay | Admitting: Internal Medicine

## 2016-11-24 ENCOUNTER — Ambulatory Visit (INDEPENDENT_AMBULATORY_CARE_PROVIDER_SITE_OTHER): Payer: Medicare Other

## 2016-11-24 DIAGNOSIS — Z23 Encounter for immunization: Secondary | ICD-10-CM

## 2016-11-29 ENCOUNTER — Encounter: Payer: Self-pay | Admitting: Internal Medicine

## 2016-12-22 ENCOUNTER — Ambulatory Visit (INDEPENDENT_AMBULATORY_CARE_PROVIDER_SITE_OTHER): Payer: Medicare Other | Admitting: Internal Medicine

## 2016-12-22 ENCOUNTER — Encounter: Payer: Self-pay | Admitting: Internal Medicine

## 2016-12-22 VITALS — BP 128/74 | HR 74 | Temp 97.7°F | Ht 61.0 in | Wt 132.0 lb

## 2016-12-22 DIAGNOSIS — Z86010 Personal history of colonic polyps: Secondary | ICD-10-CM

## 2016-12-22 DIAGNOSIS — Z0000 Encounter for general adult medical examination without abnormal findings: Secondary | ICD-10-CM

## 2016-12-22 DIAGNOSIS — I480 Paroxysmal atrial fibrillation: Secondary | ICD-10-CM | POA: Diagnosis not present

## 2016-12-22 LAB — COMPREHENSIVE METABOLIC PANEL
ALBUMIN: 4.3 g/dL (ref 3.5–5.2)
ALK PHOS: 78 U/L (ref 39–117)
ALT: 19 U/L (ref 0–35)
AST: 21 U/L (ref 0–37)
BILIRUBIN TOTAL: 1 mg/dL (ref 0.2–1.2)
BUN: 17 mg/dL (ref 6–23)
CALCIUM: 9.9 mg/dL (ref 8.4–10.5)
CHLORIDE: 100 meq/L (ref 96–112)
CO2: 29 meq/L (ref 19–32)
CREATININE: 0.75 mg/dL (ref 0.40–1.20)
GFR: 79.43 mL/min (ref >60.00)
Glucose, Bld: 92 mg/dL (ref 70–99)
POTASSIUM: 4 meq/L (ref 3.5–5.1)
Sodium: 138 mEq/L (ref 135–145)
Total Protein: 7.3 g/dL (ref 6.0–8.3)

## 2016-12-22 LAB — CBC WITH DIFFERENTIAL/PLATELET
BASOS ABS: 0.1 10*3/uL (ref 0.0–0.1)
Basophils Relative: 1 % (ref 0.0–3.0)
EOS ABS: 0.2 10*3/uL (ref 0.0–0.7)
Eosinophils Relative: 2 % (ref 0.0–5.0)
HEMATOCRIT: 39.6 % (ref 36.0–46.0)
Hemoglobin: 13.1 g/dL (ref 12.0–15.0)
LYMPHS ABS: 2.7 10*3/uL (ref 0.7–4.0)
LYMPHS PCT: 34.7 % (ref 12.0–46.0)
MCHC: 33 g/dL (ref 30.0–36.0)
MCV: 91.4 fl (ref 78.0–100.0)
MONOS PCT: 9.8 % (ref 3.0–12.0)
Monocytes Absolute: 0.8 10*3/uL (ref 0.1–1.0)
NEUTROS ABS: 4.1 10*3/uL (ref 1.4–7.7)
NEUTROS PCT: 52.5 % (ref 43.0–77.0)
PLATELETS: 299 10*3/uL (ref 150.0–400.0)
RBC: 4.34 Mil/uL (ref 3.87–5.11)
RDW: 13.2 % (ref 11.5–15.5)
WBC: 7.9 10*3/uL (ref 4.0–10.5)

## 2016-12-22 LAB — LIPID PANEL
CHOL/HDL RATIO: 4
CHOLESTEROL: 198 mg/dL (ref 0–200)
HDL: 55.4 mg/dL (ref >39.00)
LDL CALC: 107 mg/dL — AB (ref 0–99)
NonHDL: 142.44
TRIGLYCERIDES: 175 mg/dL — AB (ref 0.0–149.0)
VLDL: 35 mg/dL (ref 0.0–40.0)

## 2016-12-22 LAB — TSH: TSH: 1.71 u[IU]/mL (ref 0.35–4.50)

## 2016-12-22 NOTE — Progress Notes (Signed)
Subjective:    Patient ID: Sydney Lucas, female    DOB: 1940/04/04, 76 y.o.   MRN: [id number]  HPI  76 year old patient who is seen today for a preventive health examination and subsequent Medicare wellness visit She enjoys excellent health.  She has a remote history of paroxysmal atrial fibrillation in the setting of an acute diarrheal illness that has not reoccurred.  She has a history of colonic polyps and had a final screening colonoscopy in 2016.  She is doing well today She takes no chronic medications  Social history.  She has a sister in assisted living that will soon be residing with her family history  Father died at 34 and had a history of diabetes coronary artery disease bladder and skin cancer.  Mother is still living at 6 with a history of diabetes One sister has history of depression and chronic debility  Past Medical History:  Diagnosis Date  . Headache(784.0)      Social History   Socioeconomic History  . Marital status: Married    Spouse name: Not on file  . Number of children: Not on file  . Years of education: Not on file  . Highest education level: Not on file  Social Needs  . Financial resource strain: Not on file  . Food insecurity - worry: Not on file  . Food insecurity - inability: Not on file  . Transportation needs - medical: Not on file  . Transportation needs - non-medical: Not on file  Occupational History  . Not on file  Tobacco Use  . Smoking status: Former Smoker    Last attempt to quit: 02/08/1968    Years since quitting: 48.9  . Smokeless tobacco: Never Used  Substance and Sexual Activity  . Alcohol use: No  . Drug use: No  . Sexual activity: Not on file  Other Topics Concern  . Not on file  Social History Narrative  . Not on file    History reviewed. No pertinent surgical history.  History reviewed. No pertinent family history.  Allergies  Allergen Reactions  . Penicillins     REACTION: rash    Current  Outpatient Medications on File Prior to Visit  Medication Sig Dispense Refill  . Biotin 1000 MCG tablet Take 1,000 mcg by mouth daily.      . Calcium Carbonate-Vit D-Min (CALTRATE 600+D PLUS) 600-400 MG-UNIT per tablet Take 1 tablet by mouth daily.      . Flaxseed, Linseed, (FLAX SEED OIL) 1000 MG CAPS Take 1 capsule by mouth.    Marland Kitchen ibuprofen (ADVIL,MOTRIN) 200 MG tablet Take 400 mg by mouth every 8 (eight) hours as needed.    . Magnesium 250 MG TABS Take by mouth daily.      . Multiple Vitamin (MULTIVITAMIN) tablet Take 1 tablet by mouth daily.      . vitamin B-12 (CYANOCOBALAMIN) 100 MCG tablet Take 50 mcg by mouth daily.      . vitamin C (ASCORBIC ACID) 500 MG tablet Take 500 mg by mouth daily.     No current facility-administered medications on file prior to visit.     BP 128/74 (BP Location: Left Arm, Patient Position: Sitting, Cuff Size: Normal)   Pulse 74   Temp 97.7 F (36.5 C) (Oral)   Ht 5\' 1"  (1.549 m)   Wt 132 lb (59.9 kg)   SpO2 98%   BMI 24.94 kg/m   Subsequent Medicare wellness visit  1. Risk factors, based on past  M,S,F  history.  No cardiovascular risk factors.  Patient does have a history of PAF  2.  Physical activities: Remains active.  No exercise limitations  3.  Depression/mood: No history of major depression or mood disorder  4.  Hearing: No deficits  5.  ADL's: Independent  6.  Fall risk: Low  7.  Home safety: No problems identified  8.  Height weight, and visual acuity; height and weight stable no change in visual acuity is followed by ophthalmology annually  9.  Counseling: Continue active lifestyle and heart healthy diet  10. Lab orders based on risk factors: Laboratory profile will be reviewed  11. Referral : Not appropriate at this time  12. Care plan: Patient will continue to have annual mammograms.  Mammogram was obtained last month.  Annual eye examinations encouraged.  No further screening colonoscopies  13. Cognitive assessment: Alert  and appropriate with normal affect no cognitive dysfunction 14. Screening: Patient provided with a written and personalized 5-10 year screening schedule in the AVS.    15. Provider List Update: Primary care ophthalmology    Review of Systems  Constitutional: Negative.   HENT: Negative for congestion, dental problem, hearing loss, rhinorrhea, sinus pressure, sore throat and tinnitus.   Eyes: Negative for pain, discharge and visual disturbance.  Respiratory: Negative for cough and shortness of breath.   Cardiovascular: Negative for chest pain, palpitations and leg swelling.  Gastrointestinal: Negative for abdominal distention, abdominal pain, blood in stool, constipation, diarrhea, nausea and vomiting.  Genitourinary: Negative for difficulty urinating, dysuria, flank pain, frequency, hematuria, pelvic pain, urgency, vaginal bleeding, vaginal discharge and vaginal pain.  Musculoskeletal: Negative for arthralgias, gait problem and joint swelling.  Skin: Negative for rash.  Neurological: Negative for dizziness, syncope, speech difficulty, weakness, numbness and headaches.  Hematological: Negative for adenopathy.  Psychiatric/Behavioral: Negative for agitation, behavioral problems and dysphoric mood. The patient is not nervous/anxious.        Objective:   Physical Exam  Constitutional: She is oriented to person, place, and time. She appears well-developed and well-nourished.  HENT:  Head: Normocephalic and atraumatic.  Right Ear: External ear normal.  Left Ear: External ear normal.  Mouth/Throat: Oropharynx is clear and moist.  Eyes: Conjunctivae and EOM are normal.  Neck: Normal range of motion. Neck supple. No JVD present. No thyromegaly present.  Cardiovascular: Normal rate, regular rhythm, normal heart sounds and intact distal pulses.  No murmur heard. Dorsalis pedis pulses faint.  Posterior tibial pulses full  Pulmonary/Chest: Effort normal and breath sounds normal. She has no  wheezes. She has no rales.  Abdominal: Soft. Bowel sounds are normal. She exhibits no distension and no mass. There is no tenderness. There is no rebound and no guarding.  Genitourinary: Vagina normal.  Musculoskeletal: Normal range of motion. She exhibits no edema or tenderness.  Neurological: She is alert and oriented to person, place, and time. She has normal reflexes. No cranial nerve deficit. She exhibits normal muscle tone. Coordination normal.  Skin: Skin is warm and dry. No rash noted.  Psychiatric: She has a normal mood and affect. Her behavior is normal.          Assessment & Plan:   Preventive health examination Subsequent Medicare wellness visit History of colonic polyps Paroxysmal atrial fibrillation  Will review screening lab.  Will continue calcium and vitamin D supplements and active lifestyle Follow-up 1 year or as needed  Immunization history reviewed.  Patient declines to update immunizations  Nyoka Cowden

## 2016-12-22 NOTE — Addendum Note (Signed)
Addended by: Bluford Kaufmann F on: 12/22/2016 03:14 PM   Modules accepted: Orders

## 2016-12-22 NOTE — Patient Instructions (Addendum)
Take a calcium supplement, plus [phone] units of vitamin D   Health Maintenance for Postmenopausal Women Menopause is a normal process in which your reproductive ability comes to an end. This process happens gradually over a span of months to years, usually between the ages of 21 and 24. Menopause is complete when you have missed 12 consecutive menstrual periods. It is important to talk with your health care provider about some of the most common conditions that affect postmenopausal women, such as heart disease, cancer, and bone loss (osteoporosis). Adopting a healthy lifestyle and getting preventive care can help to promote your health and wellness. Those actions can also lower your chances of developing some of these common conditions. What should I know about menopause? During menopause, you may experience a number of symptoms, such as:  Moderate-to-severe hot flashes.  Night sweats.  Decrease in sex drive.  Mood swings.  Headaches.  Tiredness.  Irritability.  Memory problems.  Insomnia.  Choosing to treat or not to treat menopausal changes is an individual decision that you make with your health care provider. What should I know about hormone replacement therapy and supplements? Hormone therapy products are effective for treating symptoms that are associated with menopause, such as hot flashes and night sweats. Hormone replacement carries certain risks, especially as you become older. If you are thinking about using estrogen or estrogen with progestin treatments, discuss the benefits and risks with your health care provider. What should I know about heart disease and stroke? Heart disease, heart attack, and stroke become more likely as you age. This may be due, in part, to the hormonal changes that your body experiences during menopause. These can affect how your body processes dietary fats, triglycerides, and cholesterol. Heart attack and stroke are both medical  emergencies. There are many things that you can do to help prevent heart disease and stroke:  Have your blood pressure checked at least every 1-2 years. High blood pressure causes heart disease and increases the risk of stroke.  If you are 77-40 years old, ask your health care provider if you should take aspirin to prevent a heart attack or a stroke.  Do not use any tobacco products, including cigarettes, chewing tobacco, or electronic cigarettes. If you need help quitting, ask your health care provider.  It is important to eat a healthy diet and maintain a healthy weight. ? Be sure to include plenty of vegetables, fruits, low-fat dairy products, and lean protein. ? Avoid eating foods that are high in solid fats, added sugars, or salt (sodium).  Get regular exercise. This is one of the most important things that you can do for your health. ? Try to exercise for at least 150 minutes each week. The type of exercise that you do should increase your heart rate and make you sweat. This is known as moderate-intensity exercise. ? Try to do strengthening exercises at least twice each week. Do these in addition to the moderate-intensity exercise.  Know your numbers.Ask your health care provider to check your cholesterol and your blood glucose. Continue to have your blood tested as directed by your health care provider.  What should I know about cancer screening? There are several types of cancer. Take the following steps to reduce your risk and to catch any cancer development as early as possible. Breast Cancer  Practice breast self-awareness. ? This means understanding how your breasts normally appear and feel. ? It also means doing regular breast self-exams. Let your health care provider know  about any changes, no matter how small.  If you are 16 or older, have a clinician do a breast exam (clinical breast exam or CBE) every year. Depending on your age, family history, and medical history, it  may be recommended that you also have a yearly breast X-ray (mammogram).  If you have a family history of breast cancer, talk with your health care provider about genetic screening.  If you are at high risk for breast cancer, talk with your health care provider about having an MRI and a mammogram every year.  Breast cancer (BRCA) gene test is recommended for women who have family members with BRCA-related cancers. Results of the assessment will determine the need for genetic counseling and BRCA1 and for BRCA2 testing. BRCA-related cancers include these types: ? Breast. This occurs in males or females. ? Ovarian. ? Tubal. This may also be called fallopian tube cancer. ? Cancer of the abdominal or pelvic lining (peritoneal cancer). ? Prostate. ? Pancreatic.  Cervical, Uterine, and Ovarian Cancer Your health care provider may recommend that you be screened regularly for cancer of the pelvic organs. These include your ovaries, uterus, and vagina. This screening involves a pelvic exam, which includes checking for microscopic changes to the surface of your cervix (Pap test).  For women ages 21-65, health care providers may recommend a pelvic exam and a Pap test every three years. For women ages 55-65, they may recommend the Pap test and pelvic exam, combined with testing for human papilloma virus (HPV), every five years. Some types of HPV increase your risk of cervical cancer. Testing for HPV may also be done on women of any age who have unclear Pap test results.  Other health care providers may not recommend any screening for nonpregnant women who are considered low risk for pelvic cancer and have no symptoms. Ask your health care provider if a screening pelvic exam is right for you.  If you have had past treatment for cervical cancer or a condition that could lead to cancer, you need Pap tests and screening for cancer for at least 20 years after your treatment. If Pap tests have been discontinued  for you, your risk factors (such as having a new sexual partner) need to be reassessed to determine if you should start having screenings again. Some women have medical problems that increase the chance of getting cervical cancer. In these cases, your health care provider may recommend that you have screening and Pap tests more often.  If you have a family history of uterine cancer or ovarian cancer, talk with your health care provider about genetic screening.  If you have vaginal bleeding after reaching menopause, tell your health care provider.  There are currently no reliable tests available to screen for ovarian cancer.  Lung Cancer Lung cancer screening is recommended for adults 41-59 years old who are at high risk for lung cancer because of a history of smoking. A yearly low-dose CT scan of the lungs is recommended if you:  Currently smoke.  Have a history of at least 30 pack-years of smoking and you currently smoke or have quit within the past 15 years. A pack-year is smoking an average of one pack of cigarettes per day for one year.  Yearly screening should:  Continue until it has been 15 years since you quit.  Stop if you develop a health problem that would prevent you from having lung cancer treatment.  Colorectal Cancer  This type of cancer can be detected and  can often be prevented.  Routine colorectal cancer screening usually begins at age 11 and continues through age 26.  If you have risk factors for colon cancer, your health care provider may recommend that you be screened at an earlier age.  If you have a family history of colorectal cancer, talk with your health care provider about genetic screening.  Your health care provider may also recommend using home test kits to check for hidden blood in your stool.  A small camera at the end of a tube can be used to examine your colon directly (sigmoidoscopy or colonoscopy). This is done to check for the earliest forms of  colorectal cancer.  Direct examination of the colon should be repeated every 5-10 years until age 64. However, if early forms of precancerous polyps or small growths are found or if you have a family history or genetic risk for colorectal cancer, you may need to be screened more often.  Skin Cancer  Check your skin from head to toe regularly.  Monitor any moles. Be sure to tell your health care provider: ? About any new moles or changes in moles, especially if there is a change in a mole's shape or color. ? If you have a mole that is larger than the size of a pencil eraser.  If any of your family members has a history of skin cancer, especially at a young age, talk with your health care provider about genetic screening.  Always use sunscreen. Apply sunscreen liberally and repeatedly throughout the day.  Whenever you are outside, protect yourself by wearing long sleeves, pants, a wide-brimmed hat, and sunglasses.  What should I know about osteoporosis? Osteoporosis is a condition in which bone destruction happens more quickly than new bone creation. After menopause, you may be at an increased risk for osteoporosis. To help prevent osteoporosis or the bone fractures that can happen because of osteoporosis, the following is recommended:  If you are 49-65 years old, get at least 1,000 mg of calcium and at least 600 mg of vitamin D per day.  If you are older than age 60 but younger than age 19, get at least 1,200 mg of calcium and at least 600 mg of vitamin D per day.  If you are older than age 68, get at least 1,200 mg of calcium and at least 800 mg of vitamin D per day.  Smoking and excessive alcohol intake increase the risk of osteoporosis. Eat foods that are rich in calcium and vitamin D, and do weight-bearing exercises several times each week as directed by your health care provider. What should I know about how menopause affects my mental health? Depression may occur at any age, but it  is more common as you become older. Common symptoms of depression include:  Low or sad mood.  Changes in sleep patterns.  Changes in appetite or eating patterns.  Feeling an overall lack of motivation or enjoyment of activities that you previously enjoyed.  Frequent crying spells.  Talk with your health care provider if you think that you are experiencing depression. What should I know about immunizations? It is important that you get and maintain your immunizations. These include:  Tetanus, diphtheria, and pertussis (Tdap) booster vaccine.  Influenza every year before the flu season begins.  Pneumonia vaccine.  Shingles vaccine.  Your health care provider may also recommend other immunizations. This information is not intended to replace advice given to you by your health care provider. Make sure you discuss  any questions you have with your health care provider. Document Released: 03/18/2005 Document Revised: 08/14/2015 Document Reviewed: 10/28/2014 Elsevier Interactive Patient Education  2018 [employer].

## 2017-03-06 DIAGNOSIS — H5203 Hypermetropia, bilateral: Secondary | ICD-10-CM | POA: Diagnosis not present

## 2017-03-09 ENCOUNTER — Ambulatory Visit (INDEPENDENT_AMBULATORY_CARE_PROVIDER_SITE_OTHER): Payer: Medicare Other | Admitting: Orthopedic Surgery

## 2017-03-09 ENCOUNTER — Encounter (INDEPENDENT_AMBULATORY_CARE_PROVIDER_SITE_OTHER): Payer: Self-pay | Admitting: Orthopedic Surgery

## 2017-03-09 ENCOUNTER — Ambulatory Visit (INDEPENDENT_AMBULATORY_CARE_PROVIDER_SITE_OTHER): Payer: Medicare Other

## 2017-03-09 DIAGNOSIS — M546 Pain in thoracic spine: Secondary | ICD-10-CM | POA: Diagnosis not present
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
## 2017-03-28 ENCOUNTER — Ambulatory Visit
Admission: RE | Admit: 2017-03-28 | Discharge: 2017-03-28 | Disposition: A | Discharge status: Home / Self Care | Payer: Medicare Other | Source: Ambulatory Visit | Attending: Orthopedic Surgery | Admitting: Orthopedic Surgery

## 2017-03-28 DIAGNOSIS — M546 Pain in thoracic spine: Secondary | ICD-10-CM

## 2017-03-28 DIAGNOSIS — M5124 Other intervertebral disc displacement, thoracic region: Secondary | ICD-10-CM | POA: Diagnosis not present

## 2017-03-28 DIAGNOSIS — M47814 Spondylosis without myelopathy or radiculopathy, thoracic region: Secondary | ICD-10-CM | POA: Diagnosis not present

## 2017-04-05 ENCOUNTER — Ambulatory Visit (INDEPENDENT_AMBULATORY_CARE_PROVIDER_SITE_OTHER): Payer: Medicare Other | Admitting: Orthopedic Surgery

## 2017-04-05 ENCOUNTER — Encounter (INDEPENDENT_AMBULATORY_CARE_PROVIDER_SITE_OTHER): Payer: Self-pay | Admitting: Orthopedic Surgery

## 2017-04-05 DIAGNOSIS — M546 Pain in thoracic spine: Secondary | ICD-10-CM
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
## 2017-04-06 ENCOUNTER — Encounter: Payer: Self-pay | Admitting: Adult Health

## 2017-05-16 ENCOUNTER — Encounter: Payer: Self-pay | Admitting: Adult Health

## 2017-05-17 ENCOUNTER — Encounter: Payer: Self-pay | Admitting: Adult Health

## 2017-05-19 ENCOUNTER — Encounter: Payer: Self-pay | Admitting: Adult Health

## 2017-05-31 ENCOUNTER — Encounter: Payer: Self-pay | Admitting: Adult Health

## 2017-09-14 DIAGNOSIS — H18212 Corneal edema secondary to contact lens, left eye: Secondary | ICD-10-CM | POA: Diagnosis not present

## 2017-09-14 DIAGNOSIS — S0502XA Injury of conjunctiva and corneal abrasion without foreign body, left eye, initial encounter: Secondary | ICD-10-CM | POA: Diagnosis not present
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
## 2017-11-09 ENCOUNTER — Ambulatory Visit (INDEPENDENT_AMBULATORY_CARE_PROVIDER_SITE_OTHER): Payer: Medicare Other | Admitting: *Deleted

## 2017-11-09 DIAGNOSIS — Z23 Encounter for immunization: Secondary | ICD-10-CM

## 2017-12-08 ENCOUNTER — Telehealth (INDEPENDENT_AMBULATORY_CARE_PROVIDER_SITE_OTHER): Payer: Self-pay
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
## 2017-12-08 NOTE — Telephone Encounter (Signed)
I scheduled them an appt for Monday.

## 2017-12-11 ENCOUNTER — Encounter: Payer: Medicare Other | Admitting: Family Medicine

## 2017-12-11 ENCOUNTER — Ambulatory Visit (INDEPENDENT_AMBULATORY_CARE_PROVIDER_SITE_OTHER): Payer: Medicare Other | Admitting: Orthopedic Surgery

## 2017-12-11 ENCOUNTER — Encounter (INDEPENDENT_AMBULATORY_CARE_PROVIDER_SITE_OTHER): Payer: Self-pay | Admitting: Orthopedic Surgery

## 2017-12-11 DIAGNOSIS — S42292A Other displaced fracture of upper end of left humerus, initial encounter for closed fracture: Secondary | ICD-10-CM | POA: Diagnosis not present

## 2017-12-11 MED ORDER — TRAMADOL HCL 50 MG PO TABS: ORAL_TABLET | ORAL | 0 refills | Status: DC

## 2017-12-12 ENCOUNTER — Encounter (INDEPENDENT_AMBULATORY_CARE_PROVIDER_SITE_OTHER): Payer: Self-pay | Admitting: Orthopedic Surgery

## 2017-12-12 NOTE — Telephone Encounter (Signed)
Normal pls calal thx

## 2017-12-16 ENCOUNTER — Encounter (INDEPENDENT_AMBULATORY_CARE_PROVIDER_SITE_OTHER): Payer: Self-pay | Admitting: Orthopedic Surgery
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
## 2017-12-18 ENCOUNTER — Encounter (INDEPENDENT_AMBULATORY_CARE_PROVIDER_SITE_OTHER): Payer: Self-pay | Admitting: Orthopedic Surgery

## 2017-12-18 ENCOUNTER — Ambulatory Visit (INDEPENDENT_AMBULATORY_CARE_PROVIDER_SITE_OTHER): Payer: Medicare Other | Admitting: Orthopedic Surgery

## 2017-12-18 ENCOUNTER — Ambulatory Visit (INDEPENDENT_AMBULATORY_CARE_PROVIDER_SITE_OTHER): Payer: Medicare Other

## 2017-12-18 DIAGNOSIS — S42292A Other displaced fracture of upper end of left humerus, initial encounter for closed fracture: Secondary | ICD-10-CM

## 2017-12-18 NOTE — Progress Notes (Signed)
   Post-Op Visit Note   Patient: Sydney Lucas           Date of Birth: 1940/02/13           MRN: [id number] Visit Date: 12/18/2017 PCP: Marletta Lor, MD   Assessment & Plan:  Chief Complaint:  Chief Complaint  Patient presents with  . Left Shoulder - Follow-up, Fracture   Visit Diagnoses:  1. Other closed displaced fracture of proximal end of left humerus, initial encounter     Plan: Gladyes is a patient who is now about 10 days out left proximal humerus fracture.  On exam the fracture is moving as a unit.  Deltoid fires.  Bruising and ecchymosis as expected moving his way down the arm.  Radiographs show reasonable alignment.  Plan is 11-day return with repeat radiographs.  We will do a more out of sling assessment at that time but within the sling the fracture is mending together at this point.  Follow-Up Instructions: Return in about 11 days (around 12/29/2017).   Orders:  Orders Placed This Encounter  Procedures  . XR Shoulder Left   No orders of the defined types were placed in this encounter.   Imaging: Xr Shoulder Left  Result Date: 12/18/2017 2 views left proximal humerus reviewed.  Proximal humerus fracture again seen with some anterior angulation at the fracture site.  Head remains located.  No bone fragments in the subacromial space.   PMFS History: Patient Active Problem List   Diagnosis Date Noted  . History of colonic polyps 08/02/2011  . Paroxysmal atrial fibrillation ([place]) 04/15/2011  . HEADACHE 07/14/2009  . NAUSEA ALONE 07/14/2009  . DIARRHEA, ACUTE 07/14/2009   Past Medical History:  Diagnosis Date  . Headache(784.0)     History reviewed. No pertinent family history.  History reviewed. No pertinent surgical history. Social History   Occupational History  . Not on file  Tobacco Use  . Smoking status: Former Smoker    Last attempt to quit: 02/08/1968    Years since quitting: 49.8  . Smokeless tobacco: Never Used    Substance and Sexual Activity  . Alcohol use: No  . Drug use: No  . Sexual activity: Not on file

## 2017-12-21 ENCOUNTER — Ambulatory Visit: Payer: Self-pay

## 2017-12-21 ENCOUNTER — Telehealth (INDEPENDENT_AMBULATORY_CARE_PROVIDER_SITE_OTHER): Payer: Self-pay

## 2017-12-21 NOTE — Telephone Encounter (Signed)
Noted  

## 2017-12-21 NOTE — Telephone Encounter (Signed)
Patients husband called back again before I could call them stating patient was in bad shape medically due to panic attacks I advised that they should d/c ultram if they felt it was related to that but not likely since patient had been on ultram since 11/04.   Did advise that I could not provide medical advice but let them know that they should call PCP or seek medical attention at an urgent care or ER.

## 2017-12-21 NOTE — Telephone Encounter (Signed)
Dc tramadol - go with tylrenol and robaxin 1 po q 8 # 30

## 2017-12-21 NOTE — Telephone Encounter (Signed)
Patient called stating that she has been feeling panicky from taking Tramadol.  Stated that she can't relax and has been sitting up the last two nights.  Would like to know what she needs to do?  Please advise.  Thank you.

## 2017-12-21 NOTE — Telephone Encounter (Signed)
Pt. Reports she broke her left arm 12/11/17. Was started on Tramadol for pain. Had been having anxiety during the day and night. Worse at night - "I can't even lay down." Stopped the Tramadol 2 days ago. No availability today in the office. Will go to Urgent Care. Reason for Disposition . Symptoms interfere with work or school  Answer Assessment - Initial Assessment Questions 1. SYMPTOMS: "Do you have any symptoms?"     Started Tramadol 12/11/17 for a broken left arm 2. SEVERITY: If symptoms are present, ask "Are they mild, moderate or severe?"      Feeling anxious during the day and worse at night . Not sleeping at night.  Answer Assessment - Initial Assessment Questions 1. CONCERN: "What happened that made you call today?"     Feeling anxious 2. ANXIETY SYMPTOM SCREENING: "Can you describe how you have been feeling?"  (e.g., tense, restless, panicky, anxious, keyed up, trouble sleeping, trouble concentrating)     Can not sleep 3. ONSET: "How long have you been feeling this way?"     Since 12/11/17 4. RECURRENT: "Have you felt this way before?"  If yes: "What happened that time?" "What helped these feelings go away in the past?"      No 5. RISK OF HARM - SUICIDAL IDEATION:  "Do you ever have thoughts of hurting or killing yourself?"  (e.g., yes, no, no but preoccupation with thoughts about death)   - INTENT:  "Do you have thoughts of hurting or killing yourself right NOW?" (e.g., yes, no, N/A)   - PLAN: "Do you have a specific plan for how you would do this?" (e.g., gun, knife, overdose, no plan, N/A)     No 6. RISK OF HARM - HOMICIDAL IDEATION:  "Do you ever have thoughts of hurting or killing someone else?"  (e.g., yes, no, no but preoccupation with thoughts about death)   - INTENT:  "Do you have thoughts of hurting or killing someone right NOW?" (e.g., yes, no, N/A)   - PLAN: "Do you have a specific plan for how you would do this?" (e.g., gun, knife, no plan, N/A)      No 7. FUNCTIONAL  IMPAIRMENT: "How have things been going for you overall in your life? Have you had any more difficulties than usual doing your normal daily activities?"  (e.g., better, same, worse; self-care, school, work, interactions)     No 8. SUPPORT: "Who is with you now?" "Who do you live with?" "Do you have family or friends nearby who you can talk to?"      Lives with husband 22. THERAPIST: "Do you have a counselor or therapist? Name?"     No 10. STRESSORS: "Has there been any new stress or recent changes in your life?"       No 11. CAFFEINE ABUSE: "Do you drink caffeinated beverages, and how much each day?" (e.g., coffee, tea, colas)       No 12. SUBSTANCE ABUSE: "Do you use any illegal drugs or alcohol?"       No 13. OTHER SYMPTOMS: "Do you have any other physical symptoms right now?" (e.g., chest pain, palpitations, difficulty breathing, fever)       No 14. PREGNANCY: "Is there any chance you are pregnant?" "When was your last menstrual period?"       n/a  Protocols used: ANXIETY AND PANIC ATTACK-A-AH, MEDICATION QUESTION CALL-A-AH

## 2017-12-21 NOTE — Telephone Encounter (Signed)
Please advise. I will let her know to d/c medication but is there anything that she can try for pain in place of this?

## 2017-12-21 NOTE — Telephone Encounter (Signed)
Ok thx.

## 2017-12-21 NOTE — Telephone Encounter (Signed)
Talked with patient and advised her to d/c medication per lauren's last telephone message.  Patient stated she would like to have a Rx for the panic attacks.  Cb# is [phone].  Thank you.

## 2017-12-29 ENCOUNTER — Ambulatory Visit (INDEPENDENT_AMBULATORY_CARE_PROVIDER_SITE_OTHER): Payer: Medicare Other

## 2017-12-29 ENCOUNTER — Ambulatory Visit (INDEPENDENT_AMBULATORY_CARE_PROVIDER_SITE_OTHER): Payer: Medicare Other | Admitting: Orthopedic Surgery

## 2017-12-29 ENCOUNTER — Encounter (INDEPENDENT_AMBULATORY_CARE_PROVIDER_SITE_OTHER): Payer: Self-pay | Admitting: Orthopedic Surgery

## 2017-12-29 DIAGNOSIS — S42292A Other displaced fracture of upper end of left humerus, initial encounter for closed fracture: Secondary | ICD-10-CM

## 2018-01-01 ENCOUNTER — Encounter (INDEPENDENT_AMBULATORY_CARE_PROVIDER_SITE_OTHER): Payer: Self-pay | Admitting: Orthopedic Surgery

## 2018-01-01 ENCOUNTER — Encounter: Payer: Self-pay | Admitting: Family Medicine

## 2018-01-01 ENCOUNTER — Ambulatory Visit (INDEPENDENT_AMBULATORY_CARE_PROVIDER_SITE_OTHER): Payer: Medicare Other | Admitting: Family Medicine

## 2018-01-01 VITALS — BP 120/80 | HR 87 | Temp 97.9°F | Wt 134.4 lb

## 2018-01-01 DIAGNOSIS — R000 Tachycardia, unspecified: Secondary | ICD-10-CM | POA: Diagnosis not present

## 2018-01-01 LAB — TSH: TSH: 1.41 u[IU]/mL (ref 0.35–4.50)

## 2018-01-01 LAB — COMPREHENSIVE METABOLIC PANEL
ALBUMIN: 4.5 g/dL (ref 3.5–5.2)
ALK PHOS: 123 U/L — AB (ref 39–117)
ALT: 17 U/L (ref 0–35)
AST: 18 U/L (ref 0–37)
BUN: 15 mg/dL (ref 6–23)
CALCIUM: 9.9 mg/dL (ref 8.4–10.5)
CHLORIDE: 98 meq/L (ref 96–112)
CO2: 26 mEq/L (ref 19–32)
Creatinine, Ser: 0.66 mg/dL (ref 0.40–1.20)
GFR: 91.81 mL/min (ref >60.00)
Glucose, Bld: 122 mg/dL — ABNORMAL HIGH (ref 70–99)
POTASSIUM: 4.4 meq/L (ref 3.5–5.1)
Sodium: 134 mEq/L — ABNORMAL LOW (ref 135–145)
TOTAL PROTEIN: 7.4 g/dL (ref 6.0–8.3)
Total Bilirubin: 0.8 mg/dL (ref 0.2–1.2)

## 2018-01-01 LAB — CBC WITH DIFFERENTIAL/PLATELET
BASOS PCT: 0.9 % (ref 0.0–3.0)
Basophils Absolute: 0.1 10*3/uL (ref 0.0–0.1)
EOS PCT: 1.8 % (ref 0.0–5.0)
Eosinophils Absolute: 0.2 10*3/uL (ref 0.0–0.7)
HEMATOCRIT: 37.4 % (ref 36.0–46.0)
HEMOGLOBIN: 12.8 g/dL (ref 12.0–15.0)
LYMPHS PCT: 22.6 % (ref 12.0–46.0)
Lymphs Abs: 2.3 10*3/uL (ref 0.7–4.0)
MCHC: 34.3 g/dL (ref 30.0–36.0)
MCV: 91.8 fl (ref 78.0–100.0)
MONOS PCT: 9.4 % (ref 3.0–12.0)
Monocytes Absolute: 0.9 10*3/uL (ref 0.1–1.0)
Neutro Abs: 6.6 10*3/uL (ref 1.4–7.7)
Neutrophils Relative %: 65.3 % (ref 43.0–77.0)
Platelets: 387 10*3/uL (ref 150.0–400.0)
RBC: 4.07 Mil/uL (ref 3.87–5.11)
RDW: 12.9 % (ref 11.5–15.5)
WBC: 10 10*3/uL (ref 4.0–10.5)

## 2018-01-01 LAB — TROPONIN I: TNIDX: 0.01 ug/l (ref 0.00–0.06)

## 2018-01-01 NOTE — Progress Notes (Signed)
   Post-Op Visit Note   Patient: Sydney Lucas           Date of Birth: 24-May-1940           MRN: [id number] Visit Date: 12/29/2017 PCP: Caren Macadam, MD   Assessment & Plan:  Chief Complaint:  Chief Complaint  Patient presents with  . Left Shoulder - Follow-up, Fracture   Visit Diagnoses:  1. Other closed displaced fracture of proximal end of left humerus, initial encounter     Plan: Patient presents for follow-up left proximal humerus fracture.  Date of injury 12/08/2017.  Doing somewhat better.  Had some anxiety after she took some tramadol.  On exam she does have the proximal humerus which is moving as one unit.  Deltoid is functional.  Radiographs look reasonable.  Plan at this time is physical therapy to start in 2 weeks.  I will see her back in about 4 weeks for clinical recheck.  She is taking Tylenol and ibuprofen for her symptoms and Ativan for anxiety.  Follow-Up Instructions: Return in about 4 weeks (around 01/26/2018).   Orders:  Orders Placed This Encounter  Procedures  . XR Shoulder Left  . Ambulatory referral to Physical Therapy   No orders of the defined types were placed in this encounter.   Imaging: No results found.  PMFS History: Patient Active Problem List   Diagnosis Date Noted  . History of colonic polyps 08/02/2011  . Paroxysmal atrial fibrillation ([place]) 04/15/2011   Past Medical History:  Diagnosis Date  . Headache(784.0)     Family History  Problem Relation Age of Onset  . Diabetes Father   . CAD Father     History reviewed. No pertinent surgical history. Social History   Occupational History  . Not on file  Tobacco Use  . Smoking status: Former Smoker    Last attempt to quit: 02/08/1968    Years since quitting: 49.9  . Smokeless tobacco: Never Used  Substance and Sexual Activity  . Alcohol use: No  . Drug use: No  . Sexual activity: Not on file

## 2018-01-01 NOTE — Patient Instructions (Addendum)
Try to take just 1/2 tab of the lorazepam at bedtime for 2 nights then just 1/2 tab if needed for anxiety after. If you are still needing medication after this point for anxiety, please let me know. Give yourself a week of monitoring for anxiety.  If you have recurrence of rapid heart rate that is not going away quickly or if you have additional symptoms like chest pressure or light headedness then please proceed to ER.

## 2018-01-01 NOTE — Progress Notes (Signed)
MEEKA CARTELLI DOB: 04/16/1940 Encounter date: 01/01/2018  This isa 77 y.o. female who presents to establish care. Chief Complaint  Patient presents with  . Transitions Of Care    History of present illness: Has had a hard time with pain control since humeral fracture. Was given some pain medication from ortho. Had bad reaction from this - panicky, anxious. Went back to urgent care. Was started on lorazepam which helped tremendously. Not having much pain now. Using combination of ibuprofen and tylenol. Takes the lorazepam at night which helps her. She is just afraid that this anxiety would come back. She wants a refill of this to help her. Has 10 pills left. Lorazepam 0.5mg .   Saw ortho last week. They are not planning on surgery, but doing limited exercises at home TID and then official PT in a couple of weeks. Has 2 more weeks in a sling.   Paroxysmal a fib: triggered by diarrhea historically. No issues since.  mamm, dexa - had through previous obgyn; couldn't remember name.   Recent left humeral fracture this month. Following with Dr. Marlou Sa.   No longer has issues with headaches.   Last colonoscopy 2016; no need for further  Past Medical History:  Diagnosis Date  . Headache(784.0)    No past surgical history on file. Allergies  Allergen Reactions  . Penicillins     REACTION: rash  . Tramadol Anxiety   Current Meds  Medication Sig  . Biotin 1000 MCG tablet Take 1,000 mcg by mouth daily.    . Calcium Carbonate-Vit D-Min (CALTRATE 600+D PLUS) 600-400 MG-UNIT per tablet Take 1 tablet by mouth daily.    . Flaxseed, Linseed, (FLAX SEED OIL) 1000 MG CAPS Take 1 capsule by mouth.  Marland Kitchen ibuprofen (ADVIL,MOTRIN) 200 MG tablet Take 400 mg by mouth every 8 (eight) hours as needed.  . Multiple Vitamin (MULTIVITAMIN) tablet Take 1 tablet by mouth daily.    . vitamin B-12 (CYANOCOBALAMIN) 100 MCG tablet Take 50 mcg by mouth daily.    . vitamin C (ASCORBIC ACID) 500 MG tablet  Take 500 mg by mouth daily.  . [DISCONTINUED] Magnesium 250 MG TABS Take by mouth daily.    . [DISCONTINUED] traMADol (ULTRAM) 50 MG tablet 1 po q hs prn pain   Social History   Tobacco Use  . Smoking status: Former Smoker    Last attempt to quit: 02/08/1968    Years since quitting: 49.9  . Smokeless tobacco: Never Used  Substance Use Topics  . Alcohol use: No   Family History  Problem Relation Age of Onset  . Diabetes Father   . CAD Father      Review of Systems  Constitutional: Negative for chills, fatigue and fever.  Respiratory: Negative for cough, chest tightness, shortness of breath and wheezing.   Cardiovascular: Negative for chest pain, palpitations and leg swelling.       Initially on review of systems she denied having any palpitations. Then when examined and HR palpated it was noted that she had jumped from 85 to 150. Patient states that she has had issues with faster heart rate; notes lasting seconds that started after anxiety reaction to tramadol.  Musculoskeletal:       Pain is well controlled in arm; worsens a small amount with home therapy, but states that she is tolerating well with tylenol/ibuprofen. Has stated that she feels she may have sensitivity to these medications in terms of heart rate when we discussed. Says this has been an  issue in the past.   Neurological: Negative for dizziness, weakness, light-headedness, numbness and headaches.  Psychiatric/Behavioral: The patient is nervous/anxious.        Has a lot of stressors and upon reconsideration admits that anxiety level is elevated. 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 Just a lot going on to manage. Then with fracture on top of this she feels like this was the "last straw".     Objective:  BP 120/80   Pulse 87   Temp 97.9 F (36.6 C) (Oral)   Wt 134 lb 6.4 oz (61 kg)   SpO2 96%   BMI 25.39 kg/m   Weight: 134  lb 6.4 oz (61 kg)   BP Readings from Last 3 Encounters:  01/01/18 120/80  12/22/16 128/74  08/19/16 128/80   Wt Readings from Last 3 Encounters:  01/01/18 134 lb 6.4 oz (61 kg)  12/22/16 132 lb (59.9 kg)  08/19/16 140 lb 6.4 oz (63.7 kg)    Physical Exam  Constitutional: She is oriented to person, place, and time. She appears well-developed and well-nourished. No distress.  HENT:  Head: Normocephalic and atraumatic.  Neck: Neck supple. No thyromegaly present.  Cardiovascular: Regular rhythm and normal heart sounds. Tachycardia present. Exam reveals no friction rub.  No murmur heard. No lower extremity edema  With valsalva, HR returns to 80's and is regular.   She does state that last year when having anxiety she had similar episode and after wearing holter monitor they were not able to pick up irregularity.   Pulmonary/Chest: Effort normal and breath sounds normal. No respiratory distress. She has no wheezes. She has no rales.  Lymphadenopathy:    She has no cervical adenopathy.  Neurological: She is alert and oriented to person, place, and time.  Psychiatric: Her behavior is normal. Cognition and memory are normal.    Assessment/Plan: 1. Tachycardia This has occurred historically. She is asymptomatic. Resolved quickly in office. Instructed patient that if any additional symptoms she needs to go to ER. Will rule out any contributions to tachycardia (electrolyte, anemia, thyroid) in the meanwhile. I have asked her to track heart rate. If still having episodes when I touch base with bloodwork results would refer to cardiology. She prefers minimal evaluation/specialty care if possible due to her caregiving demands.   Also instructed avoidance of caffeine, chocolate, alcohol.    - EKG 12-Lead - Comprehensive metabolic panel; Future - CBC with Differential/Platelet; Future - TSH; Future - Troponin I - TSH - CBC with Differential/Platelet - Comprehensive metabolic  panel  Return pending bloodwork results.  Micheline Rough, MD

## 2018-01-02 ENCOUNTER — Encounter: Payer: Self-pay | Admitting: Family Medicine

## 2018-01-03 ENCOUNTER — Ambulatory Visit (INDEPENDENT_AMBULATORY_CARE_PROVIDER_SITE_OTHER): Payer: Medicare Other | Admitting: Family Medicine

## 2018-01-03 ENCOUNTER — Encounter: Payer: Self-pay | Admitting: Family Medicine

## 2018-01-03 VITALS — BP 160/80 | HR 93 | Temp 98.1°F | Wt 134.5 lb

## 2018-01-03 DIAGNOSIS — I471 Supraventricular tachycardia: Secondary | ICD-10-CM | POA: Diagnosis not present

## 2018-01-03 DIAGNOSIS — F419 Anxiety disorder, unspecified: Secondary | ICD-10-CM

## 2018-01-03 NOTE — Progress Notes (Signed)
Sydney Lucas DOB: 1940-09-10 Encounter date: 01/03/2018  This is a 77 y.o. female who presents with Chief Complaint  Patient presents with  . Anxiety    started this morning, can't relax, pt states "I don't know whats wrong with me so I feel afraid"  feelings of dread and fear     History of present illness:  Feeling anxious, panicky all day. Seems to get worse as day goes on. A lot of fear, dread, anxiety. Not really felt like this before.   This seemed to start after taking the tramadol. Last dose was around 11/12.  Took 1/2 lorazepam about 1pm this afternoon. Took second half around 1:30 which is helping some.   Heart rate has felt fine today. No racing.   No chest discomfort or chest pressure. No new stressors.   Last evening felt fine. None of these symptoms were present last night. Didn't sleep well, but did sleep.   Arm is ok; somewhat achy.      Allergies  Allergen Reactions  . Penicillins     REACTION: rash  . Tramadol Anxiety   Current Meds  Medication Sig  . Biotin 1000 MCG tablet Take 1,000 mcg by mouth daily.    . Calcium Carbonate-Vit D-Min (CALTRATE 600+D PLUS) 600-400 MG-UNIT per tablet Take 1 tablet by mouth daily.    . Flaxseed, Linseed, (FLAX SEED OIL) 1000 MG CAPS Take 1 capsule by mouth.  Marland Kitchen ibuprofen (ADVIL,MOTRIN) 200 MG tablet Take 400 mg by mouth every 8 (eight) hours as needed.  . Multiple Vitamin (MULTIVITAMIN) tablet Take 1 tablet by mouth daily.    . vitamin B-12 (CYANOCOBALAMIN) 100 MCG tablet Take 50 mcg by mouth daily.    . vitamin C (ASCORBIC ACID) 500 MG tablet Take 500 mg by mouth daily.    Review of Systems  Objective:  BP (!) 160/80 (BP Location: Left Arm, Patient Position: Sitting, Cuff Size: Normal)   Pulse 93   Temp 98.1 F (36.7 C) (Oral)   Wt 134 lb 8 oz (61 kg)   SpO2 96%   BMI 25.41 kg/m   Weight: 134 lb 8 oz (61 kg)   BP Readings from Last 3 Encounters:  01/03/18 (!) 160/80  01/01/18 120/80   12/22/16 128/74   Wt Readings from Last 3 Encounters:  01/03/18 134 lb 8 oz (61 kg)  01/01/18 134 lb 6.4 oz (61 kg)  12/22/16 132 lb (59.9 kg)    Physical Exam  Constitutional: She is oriented to person, place, and time. She appears well-developed and well-nourished. No distress.  Cardiovascular: Normal rate, regular rhythm and normal heart sounds. Exam reveals no friction rub.  No murmur heard. No lower extremity edema  Pulmonary/Chest: Effort normal and breath sounds normal. No respiratory distress. She has no wheezes. She has no rales.  Neurological: She is alert and oriented to person, place, and time.  Psychiatric: Her speech is normal and behavior is normal. Her mood appears anxious. Cognition and memory are normal.  She was very panicky earlier. Feeling much better now after taking the lorazepam. Just had this feeling of worry that she wasn't going to be able to accomplish what she needed to do; worried about feeling not going away, worried that the lorazepam wasn't going to work for her.   In past she has had panic attacks with increased heart rate. These have lasted seconds to minutes and have resolved without treatment.   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 Fracture of humerus was last straw.     Assessment/Plan  1. Anxiety Improved now with the lorazepam that she took at home this afternoon. Advised that it is ok for her to take this if needed. I had discussed risks of this medication at visit yesterday and so she was trying to cut back. She has had long standing anxiety controlled without medication/treatment in past. We will check in Monday with her; if requiring more lorazepam we discussed consideration for more regular treatment for anxiety (like buspar).   2. Paroxysmal supraventricular tachycardia ([place]) Heart is in normal rhythm today and she has had no more episodes  of fast heart rate. Continue to monitor. Labwork looks ok. EKG stable.  - EKG 12-Lead   Return if symptoms worsen or fail to improve. We will check in Monday; follow up pending update at that time. Would be ok with refilling lorazepam in short term if needed.      Micheline Rough, MD

## 2018-01-06 ENCOUNTER — Encounter: Payer: Self-pay | Admitting: Family Medicine

## 2018-01-08 NOTE — Telephone Encounter (Signed)
I spoke with Sydney Lucas. She has been having a hard time. Her cat got sick on Friday night and they had to take her to urgent care. Long wait. This is when headache started. Worse through weekend and then took cat this morning and had to put her to sleep. She states that yesterday her headache was better and felt better. Thinks elevated bp and headache are stress related. No increased heart rate and doing better from "panic" standpoint.   She is bringing in her mother tomorrow and will let me know blood pressure at that time. She will call with worsening of symptoms, otherwise [name]- if someone can just send me an update tomorrow when she is here on headaches, bp, anxiety I would appreciate it! (her moms appt is at 3:30 with Tommi Rumps)

## 2018-01-09 ENCOUNTER — Encounter: Payer: Self-pay | Admitting: Family Medicine

## 2018-01-09 ENCOUNTER — Telehealth: Payer: Self-pay

## 2018-01-09 NOTE — Telephone Encounter (Signed)
Spoke with patient she stated the anxiety is better but she has a bad headache. Has had headache since November 28th. Went to urgent care Friday and was told her BP was high and that was causing her headache. Has taken tylenol but it did not help. Pt states that the lorazepam has helped with her headache.

## 2018-01-09 NOTE — Telephone Encounter (Signed)
-----   Message from Caren Macadam, MD sent at 01/03/2018  4:29 PM EST ----- See how anxiety is on Monday; how weekend went

## 2018-01-10 NOTE — Telephone Encounter (Signed)
Spoke with patient. She stated that her headache went away yesterday afternoon but has returned this morning. The pain is behind both eyes and is a 5/10 on the pain scale. Said that she did sleep better last night. She has not check her BP today, she has to go to the drug store to do so.

## 2018-01-10 NOTE — Telephone Encounter (Signed)
Can you see how her headache is doing today? Get details about it for me - where pain is, intensity on 10 point scale, whether meds are still causing relief and if she is sleeping any better? Has she checked bp today?   Further advice pending response.

## 2018-01-10 NOTE — Telephone Encounter (Signed)
Spoke with patient. Patient expressed understanding.

## 2018-01-10 NOTE — Telephone Encounter (Signed)
I would recommend she try the otc medications again for headache (she can do 600mg  of the ibuprofen or try tylenol but make sure taking as bottle directs - don't underdose the tylenol). Since sleep seems to be getting better and hopefully some of the stress surrounding her is settling a bit, I am hoping that this headache will phase out. If worsening of headache/not improving with meds/or if it persists until tomorrow she needs to be seen.   You could check back in with her tomorrow if she would like.

## 2018-01-11 ENCOUNTER — Ambulatory Visit (INDEPENDENT_AMBULATORY_CARE_PROVIDER_SITE_OTHER): Payer: Medicare Other | Admitting: Family Medicine

## 2018-01-11 ENCOUNTER — Encounter (INDEPENDENT_AMBULATORY_CARE_PROVIDER_SITE_OTHER): Payer: Self-pay | Admitting: Orthopedic Surgery

## 2018-01-11 ENCOUNTER — Encounter: Payer: Self-pay | Admitting: Family Medicine

## 2018-01-11 VITALS — BP 160/90 | HR 80 | Temp 98.2°F | Wt 131.6 lb

## 2018-01-11 DIAGNOSIS — R51 Headache: Secondary | ICD-10-CM

## 2018-01-11 DIAGNOSIS — F419 Anxiety disorder, unspecified: Secondary | ICD-10-CM

## 2018-01-11 DIAGNOSIS — I10 Essential (primary) hypertension: Secondary | ICD-10-CM

## 2018-01-11 DIAGNOSIS — R519 Headache, unspecified: Secondary | ICD-10-CM

## 2018-01-11 MED ORDER — LORAZEPAM 0.5 MG PO TABS: 0.5000 mg | ORAL_TABLET | Freq: Two times a day (BID) | ORAL | 0 refills | Status: DC | PRN

## 2018-01-11 MED ORDER — KETOROLAC TROMETHAMINE 60 MG/2ML IM SOLN
60.0000 mg | Freq: Once | INTRAMUSCULAR | Status: AC
Start: 2018-01-11 — End: 2018-01-11
  Administered 2018-01-11: 30 mg via INTRAMUSCULAR

## 2018-01-11 MED ORDER — KETOROLAC TROMETHAMINE 30 MG/ML IJ SOLN: 30.0000 mg | Freq: Once | INTRAMUSCULAR | Status: DC

## 2018-01-11 MED ORDER — PROPRANOLOL HCL 10 MG PO TABS: 10.0000 mg | ORAL_TABLET | Freq: Two times a day (BID) | ORAL | 2 refills | Status: DC

## 2018-01-11 NOTE — Progress Notes (Signed)
Sydney Lucas DOB: May 01, 1940 Encounter date: 01/11/2018  This is a 77 y.o. female who presents with Chief Complaint  Patient presents with  . Headache     1 week, pain behind both eyes, goes away and comes back, 3/10 pain today, took excedrine migraine at 9am,     History of present illness:  Headache started on thanksgiving day, has had off and on since then. Not consistent when coming and going. 2 days ago was gone through day and night. Pretty much with her. Pretty severe. In remote past had headaches. Behind both eyes. Vision not affected. No dizziness, light headedness. Sometimes nausea. No vomiting. No weakness, numbness, tingling. OTC excedrin migraine helps. It does work - 2 works better than one, but worries about taking 2 because it is only a once in 24 hour time. Plain tylenol does not work. Blood pressures have stayed elevated since last visit.  Has not had bp issues in past.   Still with stresses of caring for mother and sister.     Allergies  Allergen Reactions  . Penicillins     REACTION: rash  . Tramadol Anxiety   Current Meds  Medication Sig  . Biotin 1000 MCG tablet Take 1,000 mcg by mouth daily.    . Calcium Carbonate-Vit D-Min (CALTRATE 600+D PLUS) 600-400 MG-UNIT per tablet Take 1 tablet by mouth daily.    . Flaxseed, Linseed, (FLAX SEED OIL) 1000 MG CAPS Take 1 capsule by mouth.  Marland Kitchen ibuprofen (ADVIL,MOTRIN) 200 MG tablet Take 400 mg by mouth every 8 (eight) hours as needed.  . Multiple Vitamin (MULTIVITAMIN) tablet Take 1 tablet by mouth daily.    . vitamin B-12 (CYANOCOBALAMIN) 100 MCG tablet Take 50 mcg by mouth daily.    . vitamin C (ASCORBIC ACID) 500 MG tablet Take 500 mg by mouth daily.    Review of Systems  Constitutional: Negative for fever.  HENT: Negative for congestion and sinus pain.   Eyes: Negative for photophobia, pain and visual disturbance.  Respiratory: Negative for cough and shortness of breath.   Cardiovascular: Negative  for chest pain and palpitations.  Neurological: Positive for headaches. Negative for dizziness, speech difficulty, weakness, light-headedness and numbness.  Psychiatric/Behavioral: Positive for sleep disturbance (has been better last couple of nights). The patient is nervous/anxious.     Objective:  BP (!) 160/90 (BP Location: Left Arm, Patient Position: Sitting, Cuff Size: Normal)   Pulse 80   Temp 98.2 F (36.8 C) (Oral)   Wt 131 lb 9.6 oz (59.7 kg)   SpO2 96%   BMI 24.87 kg/m   Weight: 131 lb 9.6 oz (59.7 kg)   BP Readings from Last 3 Encounters:  01/11/18 (!) 160/90  01/03/18 (!) 160/80  01/01/18 120/80   Wt Readings from Last 3 Encounters:  01/11/18 131 lb 9.6 oz (59.7 kg)  01/03/18 134 lb 8 oz (61 kg)  01/01/18 134 lb 6.4 oz (61 kg)    Physical Exam  Constitutional: She is oriented to person, place, and time. She appears well-developed and well-nourished. No distress.  HENT:  Head: Normocephalic and atraumatic.  Right Ear: External ear normal.  Left Ear: External ear normal.  Mouth/Throat: Oropharynx is clear and moist.  Eyes: Pupils are equal, round, and reactive to light. Conjunctivae are normal.  Neck: Neck supple. No thyromegaly present.  Cardiovascular: Normal rate, regular rhythm and normal heart sounds. Exam reveals no gallop and no friction rub.  No murmur heard. No lower extremity edema  Pulmonary/Chest: Effort  normal and breath sounds normal. No respiratory distress. She has no wheezes. She has no rales.  Lymphadenopathy:    She has no cervical adenopathy.  Neurological: She is alert and oriented to person, place, and time. She has normal strength. She displays normal reflexes. No cranial nerve deficit. She exhibits normal muscle tone. Gait normal.  Reflex Scores:      Tricep reflexes are 2+ on the right side and 2+ on the left side.      Bicep reflexes are 2+ on the right side and 2+ on the left side.      Brachioradialis reflexes are 2+ on the right  side and 2+ on the left side.      Patellar reflexes are 2+ on the right side and 2+ on the left side. Skin: Skin is warm and dry. She is not diaphoretic.  Psychiatric: She has a normal mood and affect. Her behavior is normal. Cognition and memory are normal.    Assessment/Plan 1. Hypertension, unspecified type Blood sugar has been slightly elevated.  I do think that there is a stress/anxiety component that is contributing.  I am concerned that if we treat with a typical dose of medication that she will have issues with hypotension, since she has no baseline issues with elevated blood pressure.  I do think that recent death of her pet, elderly mother, and somewhat dependent sister as well as husband with Parkinson's disease are contributing to her stress level.  Additionally her left arm fracture and limitations from this are frustrating for her.  I am hopeful the treatment with low-dose propranolol will help also with some underlying anxiety and possibly with headache.  We did discuss that dose we are starting with the much lower than typical dose used for treatment of these conditions, but I think it is a reasonable starting place given her age and history.  We will check in with her frequently.  Have her schedule follow-up in 1 week in the office but will be in touch by phone sooner.  Recommend home cuff for checking rather than using drugstore cuff. - propranolol (INDERAL) 10 MG tablet; Take 1 tablet (10 mg total) by mouth 2 (two) times daily.  Dispense: 60 tablet; Refill: 2  2. Nonintractable headache, unspecified chronicity pattern, unspecified headache type - ketorolac (TORADOL) injection 60 mg Discussed limiting over-the-counter medications due to concerns for possible rebound headache.  Encouraged her to take recommended dose of Excedrin Migraine with onset of headache since this does seem to treat issue.  If headaches do not resolve by next appointment, will need further evaluation.  3.  Anxiety: I did refill the lorazepam for her to have on hand.  We have discussed risks of this medication and she does use sparingly.  If anxiety continues with certainly consider a longer acting medication for better control at baseline.    Return in about 1 week (around 01/18/2018), or followup.     Micheline Rough, MD

## 2018-01-11 NOTE — Telephone Encounter (Signed)
y

## 2018-01-12 ENCOUNTER — Telehealth: Payer: Self-pay

## 2018-01-12 NOTE — Telephone Encounter (Signed)
Patient is calling and states that her headache is still there but it is better and she will call back once she checks her blood pressure in a little while.

## 2018-01-12 NOTE — Telephone Encounter (Signed)
-----   Message from Caren Macadam, MD sent at 01/12/2018 12:32 AM EST ----- Sorry if I sent this already. I just wanted you to touch base with her Friday, before weekend. See how she did w med and make sure she isn't any worse w headache/bp. She will see me next week as long as she is doing ok. If doing ok, I would be happy if you would check in Monday with her too. I think regular check ins for her are very helpful/reassuring. She does use mychart so message through there is fine. Thanks!

## 2018-01-12 NOTE — Telephone Encounter (Signed)
Patient was seen in the office yesterday.

## 2018-01-12 NOTE — Telephone Encounter (Signed)
Left message for patient to call back. CRM created 

## 2018-01-12 NOTE — Telephone Encounter (Signed)
Patient calling and states that she had taken her blood pressure at 11:30am and it was 153/88. CB#: [phone]

## 2018-01-14 ENCOUNTER — Encounter: Payer: Self-pay | Admitting: Family Medicine

## 2018-01-15 NOTE — Telephone Encounter (Signed)
Patient sent in a mychart message on the same topic. Please see message for further documentation

## 2018-01-17 ENCOUNTER — Ambulatory Visit (INDEPENDENT_AMBULATORY_CARE_PROVIDER_SITE_OTHER): Payer: Medicare Other | Admitting: Family Medicine

## 2018-01-17 ENCOUNTER — Encounter: Payer: Self-pay | Admitting: Family Medicine

## 2018-01-17 VITALS — BP 140/70 | HR 63 | Temp 98.0°F | Wt 131.0 lb

## 2018-01-17 DIAGNOSIS — R51 Headache: Secondary | ICD-10-CM

## 2018-01-17 DIAGNOSIS — R030 Elevated blood-pressure reading, without diagnosis of hypertension: Secondary | ICD-10-CM | POA: Diagnosis not present

## 2018-01-17 DIAGNOSIS — F419 Anxiety disorder, unspecified: Secondary | ICD-10-CM | POA: Diagnosis not present

## 2018-01-17 DIAGNOSIS — R350 Frequency of micturition: Secondary | ICD-10-CM

## 2018-01-17 DIAGNOSIS — R519 Headache, unspecified: Secondary | ICD-10-CM

## 2018-01-17 LAB — POCT URINALYSIS DIPSTICK
Bilirubin, UA: NEGATIVE
Glucose, UA: NEGATIVE
KETONES UA: NEGATIVE
Leukocytes, UA: NEGATIVE
Nitrite, UA: NEGATIVE
PH UA: 6 (ref 5.0–8.0)
PROTEIN UA: NEGATIVE
Spec Grav, UA: 1.02 (ref 1.010–1.025)
UROBILINOGEN UA: 0.2 U/dL

## 2018-01-17 NOTE — Patient Instructions (Addendum)
Try to limit medication for headaches to those that are more severe and see if you are able to exercise or meditate to help with headache pain instead.   Please consider contacting behavioral health to set up appointment with Dennison Bulla. I think that working on tools to help with anxiety/stress will be very helpful for headache overall.   Change second propranolol dose to bedtime.

## 2018-01-17 NOTE — Telephone Encounter (Signed)
Will review with patient in office today.

## 2018-01-17 NOTE — Progress Notes (Signed)
Sydney Lucas DOB: May 13, 1940 Encounter date: 01/17/2018  This is a 77 y.o. female who presents with Chief Complaint  Patient presents with  . Follow-up    still has headache, comes and goes, still behind the eyes, inj helped for about a day then headache returned    History of present illness:  Headache is going away in afternoon which is an improvement. Intensity of headache is better than it was previously. Today headache is worse, but had stressful day yesterday - has to take mom and sister to doc appointments; financial issues with sister she has to straighten out today.   Has been taking the propranolol. Friday night got a little jittery feeling for awhile, but this resolved on its own. Taking dose at 5am and then 5pm.   Blood pressures at home vary - sometimes low (120's (lowest 115) highest at home has been 143. This has improved from 170'2-180's.   Headaches are still behind eyes. 7/10. Has taken the excedrin migraine every day which does help. No neck pain. No jaw pain.   Before last few weeks would get them only once every 3-6 months. Did go away after toradol but came back the next day. Some nausea, no vomiting. Not worse with change in position.     Allergies  Allergen Reactions  . Penicillins     REACTION: rash  . Tramadol Anxiety   Current Meds  Medication Sig  . Biotin 1000 MCG tablet Take 1,000 mcg by mouth daily.    . Calcium Carbonate-Vit D-Min (CALTRATE 600+D PLUS) 600-400 MG-UNIT per tablet Take 1 tablet by mouth daily.    . Flaxseed, Linseed, (FLAX SEED OIL) 1000 MG CAPS Take 1 capsule by mouth.  Marland Kitchen ibuprofen (ADVIL,MOTRIN) 200 MG tablet Take 400 mg by mouth every 8 (eight) hours as needed.  Marland Kitchen LORazepam (ATIVAN) 0.5 MG tablet Take 1 tablet (0.5 mg total) by mouth 2 (two) times daily as needed for anxiety.  . Multiple Vitamin (MULTIVITAMIN) tablet Take 1 tablet by mouth daily.    . propranolol (INDERAL) 10 MG tablet Take 1 tablet (10 mg total)  by mouth 2 (two) times daily.  . vitamin B-12 (CYANOCOBALAMIN) 100 MCG tablet Take 50 mcg by mouth daily.    . vitamin C (ASCORBIC ACID) 500 MG tablet Take 500 mg by mouth daily.    Review of Systems  Constitutional: Negative for chills, fatigue and fever.  HENT: Negative for congestion, postnasal drip, sinus pressure and sinus pain.   Eyes: Positive for photophobia (with headache). Negative for visual disturbance.  Respiratory: Negative for cough, chest tightness, shortness of breath and wheezing.   Cardiovascular: Negative for chest pain, palpitations and leg swelling.  Neurological: Positive for headaches (see hpi). Negative for dizziness, weakness, light-headedness and numbness.  Psychiatric/Behavioral: Positive for sleep disturbance (waking q 2 hours for urination. not every night. Sleeping better now than 2 weeks ago.). The patient is nervous/anxious (improved from previous. Not having severe panic attacks. Is taking daily lorazepam.).     Objective:  BP 140/70 (BP Location: Left Arm, Patient Position: Sitting, Cuff Size: Normal)   Pulse 63   Temp 98 F (36.7 C) (Oral)   Wt 131 lb (59.4 kg)   SpO2 97%   BMI 24.75 kg/m   Weight: 131 lb (59.4 kg)   BP Readings from Last 3 Encounters:  01/17/18 140/70  01/11/18 (!) 160/90  01/03/18 (!) 160/80   Wt Readings from Last 3 Encounters:  01/17/18 131 lb (59.4 kg)  01/11/18 131 lb 9.6 oz (59.7 kg)  01/03/18 134 lb 8 oz (61 kg)    Physical Exam  Constitutional: She is oriented to person, place, and time. She appears well-developed and well-nourished. No distress.  HENT:  No TMJ tenderness  Neck: Neck supple.  Cardiovascular: Normal rate, regular rhythm and normal heart sounds. Exam reveals no friction rub.  No murmur heard. No lower extremity edema  Pulmonary/Chest: Effort normal and breath sounds normal. No respiratory distress. She has no wheezes. She has no rales.  Lymphadenopathy:    She has no cervical adenopathy.   Neurological: She is alert and oriented to person, place, and time.  Psychiatric: Her behavior is normal. Cognition and memory are normal.    Assessment/Plan 1. Urinary frequency Will check UA today to make sure no underlying infection. + blood so will send for cx.  2. Nonintractable episodic headache, unspecified headache type Headache has improved in intensity and duration but still coming daily. Continue to monitor. I do not want to increase propranolol as I worry this will cause hypotension. We discussed med rebound headache. Limit med use. She had stopped morning walk in last two weeks and I think restarting this and/or having some meditation in morning would be helpful for anxiety. I do feel that headaches have been worsened by stress in last few weeks which include: arm fracture, med reaction (severe anxiety w tramadol), care for elderly mother and dependent sister, and husband with chronic illness (parkinsons) as well as illness and death of pet cat.   3. Elevated blood pressure, situational This has improved. Continue propranolol for now but switch afternoon dose to bedtime. Hoping this helps some with morning headache.  4. Anxiety Improved, will consider meeting with Dennison Bulla.     Return if symptoms worsen or fail to improve; please feel free to update me in a weeks time!Micheline Rough, MD

## 2018-01-18 LAB — URINE CULTURE
MICRO NUMBER: 91483998
Result:: NO GROWTH
SPECIMEN QUALITY:: ADEQUATE

## 2018-01-19 ENCOUNTER — Encounter: Payer: Self-pay | Admitting: Family Medicine

## 2018-01-23 ENCOUNTER — Encounter: Payer: Self-pay | Admitting: Family Medicine

## 2018-01-24 ENCOUNTER — Other Ambulatory Visit: Payer: Self-pay | Admitting: Family Medicine

## 2018-01-24 ENCOUNTER — Encounter (INDEPENDENT_AMBULATORY_CARE_PROVIDER_SITE_OTHER): Payer: Self-pay | Admitting: Orthopedic Surgery

## 2018-01-24 ENCOUNTER — Ambulatory Visit (INDEPENDENT_AMBULATORY_CARE_PROVIDER_SITE_OTHER): Payer: Medicare Other | Admitting: Orthopedic Surgery

## 2018-01-24 DIAGNOSIS — S42292A Other displaced fracture of upper end of left humerus, initial encounter for closed fracture: Secondary | ICD-10-CM

## 2018-01-24 MED ORDER — LORAZEPAM 0.5 MG PO TABS: 0.5000 mg | ORAL_TABLET | Freq: Two times a day (BID) | ORAL | 0 refills | Status: DC | PRN

## 2018-01-26 ENCOUNTER — Other Ambulatory Visit: Payer: Self-pay | Admitting: Specialist

## 2018-01-26 DIAGNOSIS — R51 Headache: Principal | ICD-10-CM

## 2018-01-26 DIAGNOSIS — R519 Headache, unspecified: Secondary | ICD-10-CM

## 2018-01-28 ENCOUNTER — Encounter (INDEPENDENT_AMBULATORY_CARE_PROVIDER_SITE_OTHER): Payer: Self-pay | Admitting: Orthopedic Surgery

## 2018-01-28 NOTE — Progress Notes (Signed)
   Post-Op Visit Note   Patient: Sydney Lucas           Date of Birth: 23-May-1940           MRN: [id number] Visit Date: 01/24/2018 PCP: Caren Macadam, MD   Assessment & Plan:  Chief Complaint:  Chief Complaint  Patient presents with  . Left Shoulder - Fracture, Follow-up   Visit Diagnoses:  1. Other closed displaced fracture of proximal end of left humerus, initial encounter     Plan: Patient presents follow-up left proximal humerus fracture.  Clinical recheck.  She is feeling better.  She is doing physical therapy plus home exercise program.  She can sleep on the left-hand side in a recliner.  On examination she has 70 degrees of forward flexion 70 degrees of abduction.  Plan at this time is to continue with range of motion and strengthening exercises with 6-week return.  Likely release her at that time.  She actually is doing quite well with this injury.  Follow-Up Instructions: Return in about 6 weeks (around 03/07/2018).   Orders:  No orders of the defined types were placed in this encounter.  No orders of the defined types were placed in this encounter.   Imaging: No results found.  PMFS History: Patient Active Problem List   Diagnosis Date Noted  . History of colonic polyps 08/02/2011  . Paroxysmal atrial fibrillation ([place]) 04/15/2011   Past Medical History:  Diagnosis Date  . Headache(784.0)     Family History  Problem Relation Age of Onset  . Diabetes Father   . CAD Father     History reviewed. No pertinent surgical history. Social History   Occupational History  . Not on file  Tobacco Use  . Smoking status: Former Smoker    Last attempt to quit: 02/08/1968    Years since quitting: 50.0  . Smokeless tobacco: Never Used  Substance and Sexual Activity  . Alcohol use: No  . Drug use: No  . Sexual activity: Not on file

## 2018-01-29 ENCOUNTER — Ambulatory Visit: Payer: Self-pay

## 2018-01-29 ENCOUNTER — Ambulatory Visit (INDEPENDENT_AMBULATORY_CARE_PROVIDER_SITE_OTHER): Payer: Medicare Other | Admitting: Orthopedic Surgery

## 2018-01-29 NOTE — Telephone Encounter (Signed)
Pt's husband called and during call husband stated thatg he will call back because he was [facility]

## 2018-01-29 NOTE — Telephone Encounter (Signed)
Pt has h/o 2-3 week headache behind her eyes. Pt was seen at [facility] and took her prescribed Impiramine Thursday and Friday. After taking the medication on Friday she began to have dizziness, nauseated and was in bed for 2 days had blurred vision, facial rash, increased heart rate and diarrhea. . Pt did not take any of the Impiramine Friday and Saturday and her symptoms went away except for her H/A. Pt took dose of Excedrin with little relief. Husband stated his wife feels better but the headache is persisting. Pt rated her headache moderate. Pt took 3 doses of her baclofen for her headache, but H/A persists. Pt has been using ice packs to her forehead to aid with the pain. Offered pt an appointment for tomorrow morning but no appointments available. Called PCP office and spoke to [place] and there are no openings for today. Husband accepted an appointment for tomorrow morning. Care advice given and husband verbalized understanding.

## 2018-01-29 NOTE — Telephone Encounter (Signed)
  Reason for Disposition . [1] MODERATE headache (e.g., interferes with normal activities) AND [2] present > 24 hours AND [3] unexplained  (Exceptions: analgesics not tried, typical migraine, or headache part of viral illness)  Answer Assessment - Initial Assessment Questions 1. LOCATION: "Where does it hurt?"      Behind eyes 2. ONSET: "When did the headache start?" (Minutes, hours or days)      2-3 weeks ago 3. PATTERN: "Does the pain come and go, or has it been constant since it started?"     constant 4. SEVERITY: "How bad is the pain?" and "What does it keep you from doing?"  (e.g., Scale 1-10; mild, moderate, or severe)   - MILD (1-3): doesn't interfere with normal activities    - MODERATE (4-7): interferes with normal activities or awakens from sleep    - SEVERE (8-10): excruciating pain, unable to do any normal activities        moderate 5. RECURRENT SYMPTOM: "Have you ever had headaches before?" If so, ask: "When was the last time?" and "What happened that time?"      Yes- nothing recent 6. CAUSE: "What do you think is causing the headache?"     migraine 7. MIGRAINE: "Have you been diagnosed with migraine headaches?" If so, ask: "Is this headache similar?"      no 8. HEAD INJURY: "Has there been any recent injury to the head?"      no 9. OTHER SYMPTOMS: "Do you have any other symptoms?" (fever, stiff neck, eye pain, sore throat, cold symptoms)    Just headache 10. PREGNANCY: "Is there any chance you are pregnant?" "When was your last menstrual period?"       n/a  Protocols used: HEADACHE-A-AH

## 2018-01-30 ENCOUNTER — Ambulatory Visit (INDEPENDENT_AMBULATORY_CARE_PROVIDER_SITE_OTHER): Payer: Medicare Other | Admitting: Internal Medicine

## 2018-01-30 ENCOUNTER — Encounter: Payer: Self-pay | Admitting: Internal Medicine

## 2018-01-30 VITALS — BP 140/70 | HR 88 | Temp 98.0°F | Wt 126.7 lb

## 2018-01-30 DIAGNOSIS — G44209 Tension-type headache, unspecified, not intractable: Secondary | ICD-10-CM

## 2018-01-30 DIAGNOSIS — T50905A Adverse effect of unspecified drugs, medicaments and biological substances, initial encounter: Secondary | ICD-10-CM

## 2018-01-30 NOTE — Progress Notes (Signed)
Established Patient Office Visit     CC/Reason for Visit: Follow-up on headache  HPI: Sydney Lucas is a 77 y.o. female who is coming in today for the above mentioned reasons.  All of her problems started back around Thanksgiving when she suffered a shoulder fracture, was prescribed tramadol which caused her anxiety and increased blood pressure which subsequently led to migraine headaches.  She was referred to the headache clinic which she saw earlier this week and was started on imipramine and baclofen.  She took 2 pills of the imipramine and unfortunately had severe agitation, nervousness, dizziness, nausea, vomiting and diarrhea that caused her to be in bed for 2 days.  She subsequently discontinued use of these medications and comes in today for evaluation.  She does state that lately she has had increased social stressors, she is the primary caregiver for both her mother and her sister.  Her husband is concerned that she is now having severe anxiety and dread surrounding her ritual morning phone call with her mother.  She has not seen a counselor as of yet although this has been recommended by her PCP, Dr. Ethlyn Gallery.  She takes Excedrin Migraine as needed for headaches, was recently started on propranolol and will sometimes take half a tablet of Lorazepam which she states helps some.  She denies any migraine headaches today but wonders what she can take if she does get a migraine.  Past Medical/Surgical History: Past Medical History:  Diagnosis Date  . Headache(784.0)     No past surgical history on file.  Social History:  reports that she quit smoking about 50 years ago. She has never used smokeless tobacco. She reports that she does not drink alcohol or use drugs.  Allergies: Allergies  Allergen Reactions  . Penicillins     REACTION: rash  . Tramadol Anxiety    Family History:  Family History  Problem Relation Age of Onset  . Diabetes Father   . CAD Father        Current Outpatient Medications:  .  Biotin 1000 MCG tablet, Take 1,000 mcg by mouth daily.  , Disp: , Rfl:  .  Calcium Carbonate-Vit D-Min (CALTRATE 600+D PLUS) 600-400 MG-UNIT per tablet, Take 1 tablet by mouth daily.  , Disp: , Rfl:  .  Flaxseed, Linseed, (FLAX SEED OIL) 1000 MG CAPS, Take 1 capsule by mouth., Disp: , Rfl:  .  ibuprofen (ADVIL,MOTRIN) 200 MG tablet, Take 400 mg by mouth every 8 (eight) hours as needed., Disp: , Rfl:  .  LORazepam (ATIVAN) 0.5 MG tablet, Take 1 tablet (0.5 mg total) by mouth 2 (two) times daily as needed for anxiety., Disp: 30 tablet, Rfl: 0 .  Multiple Vitamin (MULTIVITAMIN) tablet, Take 1 tablet by mouth daily.  , Disp: , Rfl:  .  propranolol (INDERAL) 10 MG tablet, Take 1 tablet (10 mg total) by mouth 2 (two) times daily., Disp: 60 tablet, Rfl: 2 .  vitamin B-12 (CYANOCOBALAMIN) 100 MCG tablet, Take 50 mcg by mouth daily.  , Disp: , Rfl:  .  vitamin C (ASCORBIC ACID) 500 MG tablet, Take 500 mg by mouth daily., Disp: , Rfl:   Review of Systems:  Constitutional: Denies fever, chills, diaphoresis, appetite change and fatigue.  HEENT: Denies photophobia, eye pain, redness, hearing loss, ear pain, congestion, sore throat, rhinorrhea, sneezing, mouth sores, trouble swallowing, neck pain, neck stiffness and tinnitus.   Respiratory: Denies SOB, DOE, cough, chest tightness,  and wheezing.   Cardiovascular:  Denies chest pain, palpitations and leg swelling.  Gastrointestinal: Denies nausea, vomiting, abdominal pain, diarrhea, constipation, blood in stool and abdominal distention.  Genitourinary: Denies dysuria, urgency, frequency, hematuria, flank pain and difficulty urinating.  Endocrine: Denies: hot or cold intolerance, sweats, changes in hair or nails, polyuria, polydipsia. Musculoskeletal: Denies myalgias, back pain, joint swelling, arthralgias and gait problem.  Skin: Denies pallor, rash and wound.  Neurological: Denies dizziness, seizures, syncope,  weakness, light-headedness, numbness and headaches.  Hematological: Denies adenopathy. Easy bruising, personal or family bleeding history  Psychiatric/Behavioral: Denies suicidal ideation, mood changes, confusion, nervousness, sleep disturbance and agitation    Physical Exam: Vitals:   01/30/18 0803  BP: 140/70  Pulse: 88  Temp: 98 F (36.7 C)  TempSrc: Oral  SpO2: 96%  Weight: 126 lb 11.2 oz (57.5 kg)    Body mass index is 23.94 kg/m.   Constitutional: NAD, calm, comfortable Eyes: PERRL, lids and conjunctivae normal ENMT: Mucous membranes are moist.  Respiratory: clear to auscultation bilaterally, no wheezing, no crackles. Normal respiratory effort. No accessory muscle use.  Cardiovascular: Regular rate and rhythm, no murmurs / rubs / gallops. No extremity edema. 2+ pedal pulses. No carotid bruits.  Neurologic: Grossly intact and nonfocal Psychiatric: Normal judgment and insight. Alert and oriented x 3. Normal mood.    Impression and Plan:  Acute non intractable tension-type headache Adverse effect of drug, initial encounter -Due to the side effects that she experienced with imipramine, which are known side effects, she has decided to discontinue use.  I agree. -She will follow-up with the headache clinic after the holidays for further recommendations. -She tells me that she gets approximately 2 headaches per week that is typically well controlled with Excedrin Migraine.  For now she has been instructed to continue to use Excedrin as needed for migraines.  She has been advised that this is not a medication that she should be taking every day.  Advised to continue propranolol for migraine prophylaxis in the meantime. -Have strongly advised that she should see a behavioral health counselor for CBT and management of her social stressors.  She agrees to make appointment with Dennison Bulla today.    Patient Instructions  -It was nice meeting you!  -Stop taking imipramine as  you have done.  -May use Excedrine Migraine if needed for Headache sporadically. If severe, may take a benadryl with it, but do this at nighttime or when you can plan on sleeping for at least 6 hours.  -Recommend follow up with Dennison Bulla, counselor.  -Schedule follow up with Dr. Ethlyn Gallery in 4 weeks.     Lelon Frohlich, MD [facility]

## 2018-01-30 NOTE — Patient Instructions (Signed)
-  It was nice meeting you!  -Stop taking imipramine as you have done.  -May use Excedrine Migraine if needed for Headache sporadically. If severe, may take a benadryl with it, but do this at nighttime or when you can plan on sleeping for at least 6 hours.  -Recommend follow up with Dennison Bulla, counselor.  -Schedule follow up with Dr. Ethlyn Gallery in 4 weeks.

## 2018-02-02 ENCOUNTER — Encounter: Payer: Self-pay | Admitting: Family Medicine

## 2018-02-02 ENCOUNTER — Ambulatory Visit (INDEPENDENT_AMBULATORY_CARE_PROVIDER_SITE_OTHER): Payer: Medicare Other | Admitting: Family Medicine

## 2018-02-02 VITALS — BP 150/80 | HR 85 | Temp 98.1°F | Wt 124.1 lb

## 2018-02-02 DIAGNOSIS — R4589 Other symptoms and signs involving emotional state: Secondary | ICD-10-CM

## 2018-02-02 DIAGNOSIS — F419 Anxiety disorder, unspecified: Secondary | ICD-10-CM | POA: Diagnosis not present

## 2018-02-02 DIAGNOSIS — F329 Major depressive disorder, single episode, unspecified: Secondary | ICD-10-CM | POA: Diagnosis not present

## 2018-02-02 MED ORDER — CITALOPRAM HYDROBROMIDE 10 MG PO TABS
10.0000 mg | ORAL_TABLET | Freq: Every day | ORAL | 2 refills | Status: DC
Start: 2018-02-02 — End: 2018-03-27

## 2018-02-02 NOTE — Patient Instructions (Signed)
OK to start with a half tablet of the citalopram at bedtime. Let us know if any problems with medication. Otherwise we will check in next week.

## 2018-02-02 NOTE — Progress Notes (Signed)
Sydney Lucas DOB: 1940/08/18 Encounter date: 02/02/2018  This is a 77 y.o. female who presents with Chief Complaint  Patient presents with  . Follow-up    migraine stpped yesterday morning, no migrane today, pt states she has anxiety today    History of present illness:  Thursday the 19th was happy to get in with [facility]. Had high expectations after battling headache for weeks. She was given imipramine and then it really set her back to worse than she has ever been. For 2 nights had extreme headache, dizziness, sick in morning. Was in bed for 2 days. Blurred vision, couldn't eat, increased HR, diarrhea. Called headache center and they told her they weren't sure what they could do for her. Told her to stop all the OTC medications. Was given baclofen 1/2 tablet Sat evening and then BID on Sunday which helped, but this was meant to be an emergency medication.   Saw Dr. Jerilee Hoh on Tuesday, but wanted to follow back up with this provider to update on experience with headache clinic and discuss current concerns and potential treatments.   Still had headache prior to wellness center, but was functioning. Things have been rough since that time at headache wellness center. Anxiety and depression are at a high. She has not been able to do things around the house that she was previously managing. She has not been checking in with mother and sister and she is the primary care giver for them. Husband has been doing this which bothers her because he has dx of Parkinsons and she feels he shouldn't be having to manage this.   Yesterday she was so happy that head stopped hurting and felt that she was confident that she could "live her life". Last night-this morning started feeling anxious. Afraid to take the lorazepam - still anxious, weak, sick feeling this morning. Feels that other symptoms have improved but still continues with diarrhea. Heart racing just on the imiprimine, but  not since.  Head not hurting today, but feels "weird" sensation over forehead. No thoughts of hurting self, but just feels she cannot go on if she feels like this every day.     Last night was first anxiety episode in about a week.   Has never been on medication for anxiety or depression in the past.   Allergies  Allergen Reactions  . Imipramine Nausea Only and Anxiety    Blurred vision, increased heart rate,   . Penicillins     REACTION: rash  . Tramadol Anxiety   Current Meds  Medication Sig  . Biotin 1000 MCG tablet Take 1,000 mcg by mouth daily.    . Calcium Carbonate-Vit D-Min (CALTRATE 600+D PLUS) 600-400 MG-UNIT per tablet Take 1 tablet by mouth daily.    . Flaxseed, Linseed, (FLAX SEED OIL) 1000 MG CAPS Take 1 capsule by mouth.  Marland Kitchen LORazepam (ATIVAN) 0.5 MG tablet Take 1 tablet (0.5 mg total) by mouth 2 (two) times daily as needed for anxiety.  . Multiple Vitamin (MULTIVITAMIN) tablet Take 1 tablet by mouth daily.    . vitamin B-12 (CYANOCOBALAMIN) 100 MCG tablet Take 50 mcg by mouth daily.    . vitamin C (ASCORBIC ACID) 500 MG tablet Take 500 mg by mouth daily.  . [DISCONTINUED] ibuprofen (ADVIL,MOTRIN) 200 MG tablet Take 400 mg by mouth every 8 (eight) hours as needed.  . [DISCONTINUED] propranolol (INDERAL) 10 MG tablet Take 1 tablet (10 mg total) by mouth 2 (two) times daily.  Review of Systems  Constitutional: Positive for fatigue. Negative for chills and fever. Appetite change: decreased, but improved in last day.  Respiratory: Negative for cough, chest tightness and shortness of breath.   Cardiovascular: Negative for chest pain and palpitations.  Neurological: Headaches: no headache today.  Psychiatric/Behavioral: Positive for sleep disturbance (wakes frequently for urination, but usually falls back asleep). The patient is nervous/anxious.     Objective:  BP (!) 150/80 (BP Location: Left Arm, Patient Position: Sitting, Cuff Size: Normal)   Pulse 85   Temp 98.1  F (36.7 C) (Oral)   Wt 124 lb 1.6 oz (56.3 kg)   SpO2 96%   BMI 23.45 kg/m   Weight: 124 lb 1.6 oz (56.3 kg)   BP Readings from Last 3 Encounters:  02/02/18 (!) 150/80  01/30/18 140/70  01/17/18 140/70   Wt Readings from Last 3 Encounters:  02/02/18 124 lb 1.6 oz (56.3 kg)  01/30/18 126 lb 11.2 oz (57.5 kg)  01/17/18 131 lb (59.4 kg)    Physical Exam Constitutional:      General: She is not in acute distress.    Appearance: She is well-developed.  Cardiovascular:     Rate and Rhythm: Normal rate and regular rhythm.     Heart sounds: Normal heart sounds. No murmur. No friction rub.     Comments: No lower extremity edema Pulmonary:     Effort: Pulmonary effort is normal. No respiratory distress.     Breath sounds: Normal breath sounds. No wheezing or rales.  Neurological:     Mental Status: She is alert and oriented to person, place, and time.  Psychiatric:        Mood and Affect: Mood is anxious.        Speech: Speech normal.        Behavior: Behavior normal.     Comments: She is tearful on exam today. She states that she has always been strong and is surprised that she is not handling stresses now. In the last couple of months has had the arm fracture, has had 2 cats that were sick and needed to be put down, and it still trying to manage care for elderly mother and sister who refuse outside help. There is no other family in area, so her husband with Parkinson's is managing things around the home and with her family.      Assessment/Plan 1. Anxiety Start low dose citalopram. Discussed side effects. Try 1/2 tab first at bedtime. Let us know if any worsening of mood or side effects. OK to use the lorazepam sparingly to help with anxiety attacks. I did also give numbers for therapists that take medicare.  2. Depressed mood See above. Will recheck in 2 weeks. Have phone call to check in Monday with her.   Return in about 2 weeks (around 02/16/2018) for Chronic condition  visit.      Micheline Rough, MD

## 2018-02-04 ENCOUNTER — Encounter: Payer: Self-pay | Admitting: Family Medicine

## 2018-02-05 ENCOUNTER — Encounter: Payer: Self-pay | Admitting: Family Medicine

## 2018-02-05 NOTE — Telephone Encounter (Signed)
Dr. Maudie Mercury, please advise in the abscesse of Dr. Ethlyn Gallery.

## 2018-02-05 NOTE — Progress Notes (Signed)
Patient sent in a mychart message.

## 2018-02-06 ENCOUNTER — Other Ambulatory Visit: Payer: Self-pay

## 2018-02-06 ENCOUNTER — Ambulatory Visit (INDEPENDENT_AMBULATORY_CARE_PROVIDER_SITE_OTHER): Payer: Medicare Other | Admitting: Family Medicine

## 2018-02-06 ENCOUNTER — Encounter: Payer: Self-pay | Admitting: Family Medicine

## 2018-02-06 VITALS — BP 128/76 | HR 80 | Temp 98.3°F | Wt 123.6 lb

## 2018-02-06 DIAGNOSIS — R350 Frequency of micturition: Secondary | ICD-10-CM | POA: Diagnosis not present

## 2018-02-06 LAB — POCT URINALYSIS DIPSTICK
BILIRUBIN UA: POSITIVE
Blood, UA: POSITIVE
GLUCOSE UA: NEGATIVE
KETONES UA: NEGATIVE
Leukocytes, UA: NEGATIVE
NITRITE UA: NEGATIVE
Protein, UA: POSITIVE — AB
SPEC GRAV UA: 1.025 (ref 1.010–1.025)
Urobilinogen, UA: 0.2 E.U./dL
pH, UA: 6 (ref 5.0–8.0)

## 2018-02-06 LAB — URINALYSIS, MICROSCOPIC ONLY: RBC / HPF: NONE SEEN (ref 0–?)

## 2018-02-06 NOTE — Progress Notes (Signed)
  Subjective:     Patient ID: Sydney Lucas, female   DOB: 1940/07/15, 77 y.o.   MRN: [id number]  HPI Patient seen with urine frequency- especially 2 nights ago.  She said she got about 8 times at night to urinate.  She urinated without difficulty.  No burning with urination.  Symptoms were much improved last night.  No gross hematuria.  No flank pain.  No history of kidney stones.  Denies any fever or chills.  No diuretic use.  She does have some anxiety symptoms and recently started on low-dose citalopram currently 5 mg at night.  She has had some very low-grade nausea which we explained could be related to citalopram.  She drinks very little caffeine and no alcohol.  Past Medical History:  Diagnosis Date  . Headache(784.0)    History reviewed. No pertinent surgical history.  reports that she quit smoking about 50 years ago. She has never used smokeless tobacco. She reports that she does not drink alcohol or use drugs. family history includes CAD in her father; Diabetes in her father. Allergies  Allergen Reactions  . Imipramine Nausea Only and Anxiety    Blurred vision, increased heart rate,   . Penicillins     REACTION: rash  . Tramadol Anxiety     Review of Systems  Constitutional: Negative for chills and fever.  Genitourinary: Positive for frequency. Negative for difficulty urinating, dysuria, flank pain and hematuria.       Objective:   Physical Exam Constitutional:      Appearance: She is well-developed.  Cardiovascular:     Rate and Rhythm: Normal rate and regular rhythm.  Pulmonary:     Effort: Pulmonary effort is normal.     Breath sounds: Normal breath sounds.  Neurological:     Mental Status: She is alert.        Assessment:     Patient presents with 2-day history of some urine frequency.  This was especially bothersome 2 nights ago but not as bad last night.  She has no burning with urination.  Dipstick reveals 1+ blood but negative leukocytes  and negative nitrites.  She does not have history of urine urgency in the past    Plan:     -Urine was sent for culture and urine microscopy -We elected not to start antibiotics at this time given her lack of symptoms today and also lack of leukocytes or nitrites on dipstick -Follow-up promptly for any fever or worsening symptoms -Continue to minimize caffeine use -We explain her low-grade nausea may be related to citalopram but that this usually goes away after a few days.  Continue close follow-up with primary regarding her anxiety  Eulas Post MD  [facility]

## 2018-02-06 NOTE — Patient Instructions (Signed)
Watch for any fever or new symptoms such as burning with urination  We will call you with lab results.

## 2018-02-06 NOTE — Progress Notes (Signed)
POCT UA complete

## 2018-02-07 LAB — URINE CULTURE
MICRO NUMBER:: 91556578
Result:: NO GROWTH
SPECIMEN QUALITY:: ADEQUATE

## 2018-02-08 ENCOUNTER — Ambulatory Visit: Payer: Self-pay | Admitting: *Deleted

## 2018-02-08 ENCOUNTER — Encounter: Payer: Self-pay | Admitting: Family Medicine

## 2018-02-08 NOTE — Telephone Encounter (Signed)
FYI.  Patient scheduled an appointment.

## 2018-02-08 NOTE — Telephone Encounter (Addendum)
Summary: Frequent urination    Pt husband called and stated that wife is having frequent urination with no relief. Pt would like to know what she should do      Par chart note and lab report- all negative results- if symptoms persist patient needs to see Dr Inocente Salles for follow up. Appointment scheduled for follow up- patient to return call if her symptoms increase. Patient asked about OTC products and she is warned to check with pharmacist to make sure she has no drug interactions before taking. Reason for Disposition . Has to get out of bed to urinate > 2 times a night (i.e., nocturia)  Answer Assessment - Initial Assessment Questions 1. SYMPTOM: "What's the main symptom you're concerned about?" (e.g., frequency, incontinence)     Urgency and frequecy 2. ONSET: "When did the  symptoms  start?"     Sunday night- was Tuesday in the office and tested negative 3. PAIN: "Is there any pain?" If so, ask: "How bad is it?" (Scale: 1-10; mild, moderate, severe)     no 4. CAUSE: "What do you think is causing the symptoms?"     Patient thinks she has UTI 5. OTHER SYMPTOMS: "Do you have any other symptoms?" (e.g., fever, flank pain, blood in urine, pain with urination)     no 6. PREGNANCY: "Is there any chance you are pregnant?" "When was your last menstrual period?"     n/a  Protocols used: URINARY [facility]

## 2018-02-09 ENCOUNTER — Ambulatory Visit: Payer: Self-pay

## 2018-02-09 ENCOUNTER — Encounter: Payer: Self-pay | Admitting: Adult Health

## 2018-02-09 ENCOUNTER — Ambulatory Visit (INDEPENDENT_AMBULATORY_CARE_PROVIDER_SITE_OTHER): Payer: Medicare Other | Admitting: Adult Health

## 2018-02-09 VITALS — BP 160/80 | Temp 98.2°F | Wt 126.0 lb

## 2018-02-09 DIAGNOSIS — R350 Frequency of micturition: Secondary | ICD-10-CM | POA: Diagnosis not present

## 2018-02-09 LAB — POC URINALSYSI DIPSTICK (AUTOMATED)
BILIRUBIN UA: NEGATIVE
Blood, UA: POSITIVE
GLUCOSE UA: NEGATIVE
Ketones, UA: POSITIVE
LEUKOCYTES UA: NEGATIVE
NITRITE UA: POSITIVE
Protein, UA: NEGATIVE
Spec Grav, UA: 1.01 (ref 1.010–1.025)
UROBILINOGEN UA: 1 U/dL
pH, UA: 6.5 (ref 5.0–8.0)

## 2018-02-09 MED ORDER — CIPROFLOXACIN HCL 500 MG PO TABS: 500.0000 mg | ORAL_TABLET | Freq: Two times a day (BID) | ORAL | 0 refills | Status: DC

## 2018-02-09 NOTE — Telephone Encounter (Signed)
Pt.'s husband called to report pt. Continues to have frequency - "was up most of the night going to the bathroom.Her nerves are frazzled. She took 1/2 of a nerve pill." Encouraged husband to let pt. Have the other half of her Ativan. She is also taking AZO, which is not helping. Will keep appointment for today.

## 2018-02-09 NOTE — Progress Notes (Signed)
Subjective:    Patient ID: Sydney Lucas, female    DOB: 11-07-1940, 78 y.o.   MRN: [id number]  HPI  [age] year old female who  has a past medical history of Headache(784.0).  She was seen by another provider 4 days ago with urinary frequency. She denied any burning, gross hematuria or flank pain. UA was negative and Urine culture did not grow any bacteria.   Today she reports that her symptoms have become worse over the last day or so. Currently she reports frequency, urgency and dribbling. She denies hematuria or dysuria.    Review of Systems See HPI   Past Medical History:  Diagnosis Date  . Headache(784.0)     Social History   Socioeconomic History  . Marital status: Married    Spouse name: Not on file  . Number of children: Not on file  . Years of education: Not on file  . Highest education level: Not on file  Occupational History  . Not on file  Social Needs  . Financial resource strain: Not on file  . Food insecurity:    Worry: Not on file    Inability: Not on file  . Transportation needs:    Medical: Not on file    Non-medical: Not on file  Tobacco Use  . Smoking status: Former Smoker    Last attempt to quit: 02/08/1968    Years since quitting: 50.0  . Smokeless tobacco: Never Used  Substance and Sexual Activity  . Alcohol use: No  . Drug use: No  . Sexual activity: Not on file  Lifestyle  . Physical activity:    Days per week: Not on file    Minutes per session: Not on file  . Stress: Not on file  Relationships  . Social connections:    Talks on phone: Not on file    Gets together: Not on file    Attends religious service: Not on file    Active member of club or organization: Not on file    Attends meetings of clubs or organizations: Not on file    Relationship status: Not on file  . Intimate partner violence:    Fear of current or ex partner: Not on file    Emotionally abused: Not on file    Physically abused: Not on file   Forced sexual activity: Not on file  Other Topics Concern  . Not on file  Social History Narrative  . Not on file    History reviewed. No pertinent surgical history.  Family History  Problem Relation Age of Onset  . Diabetes Father   . CAD Father     Allergies  Allergen Reactions  . Imipramine Nausea Only and Anxiety    Blurred vision, increased heart rate,   . Penicillins     REACTION: rash  . Tramadol Anxiety    Current Outpatient Medications on File Prior to Visit  Medication Sig Dispense Refill  . Biotin 1000 MCG tablet Take 1,000 mcg by mouth daily.      . Calcium Carbonate-Vit D-Min (CALTRATE 600+D PLUS) 600-400 MG-UNIT per tablet Take 1 tablet by mouth daily.      . citalopram (CELEXA) 10 MG tablet Take 1 tablet (10 mg total) by mouth daily. 30 tablet 2  . Flaxseed, Linseed, (FLAX SEED OIL) 1000 MG CAPS Take 1 capsule by mouth.    Marland Kitchen LORazepam (ATIVAN) 0.5 MG tablet Take 1 tablet (0.5 mg total) by mouth 2 (two) times daily  as needed for anxiety. 30 tablet 0  . Multiple Vitamin (MULTIVITAMIN) tablet Take 1 tablet by mouth daily.      . vitamin B-12 (CYANOCOBALAMIN) 100 MCG tablet Take 50 mcg by mouth daily.      . vitamin C (ASCORBIC ACID) 500 MG tablet Take 500 mg by mouth daily.     No current facility-administered medications on file prior to visit.     BP (!) 160/80   Temp 98.2 F (36.8 C)   Wt 126 lb (57.2 kg)   BMI 23.81 kg/m       Objective:   Physical Exam Vitals signs reviewed.  Constitutional:      Appearance: She is normal weight.  Neck:     Musculoskeletal: Normal range of motion and neck supple.  Cardiovascular:     Rate and Rhythm: Normal rate and regular rhythm.  Pulmonary:     Effort: Pulmonary effort is normal.     Breath sounds: Normal breath sounds.  Abdominal:     General: Bowel sounds are normal.     Palpations: Abdomen is soft.     Tenderness: There is no left CVA tenderness.  Skin:    General: Skin is warm and dry.      Capillary Refill: Capillary refill takes 2 to 3 seconds.  Neurological:     Mental Status: She is alert.       Assessment & Plan:  1. Urinary frequency - UA currently with nitrites and blood. Will send culture and treat due to worsening symptoms  - POCT Urinalysis Dipstick (Automated) - Culture, Urine - ciprofloxacin (CIPRO) 500 MG tablet; Take 1 tablet (500 mg total) by mouth 2 (two) times daily.  Dispense: 6 tablet; Refill: 0  Dorothyann Peng, NP

## 2018-02-11 ENCOUNTER — Ambulatory Visit
Admission: RE | Admit: 2018-02-11 | Discharge: 2018-02-11 | Disposition: A | Discharge status: Home / Self Care | Payer: Medicare Other | Source: Ambulatory Visit | Attending: Specialist | Admitting: Specialist

## 2018-02-11 DIAGNOSIS — R51 Headache: Principal | ICD-10-CM

## 2018-02-11 DIAGNOSIS — R519 Headache, unspecified: Secondary | ICD-10-CM

## 2018-02-11 LAB — URINE CULTURE
MICRO NUMBER:: 8833
RESULT: NO GROWTH
SPECIMEN QUALITY: ADEQUATE

## 2018-02-11 NOTE — Telephone Encounter (Signed)
Noted  

## 2018-02-13 ENCOUNTER — Other Ambulatory Visit: Payer: Self-pay | Admitting: Nurse Practitioner

## 2018-02-13 ENCOUNTER — Ambulatory Visit: Payer: Self-pay

## 2018-02-13 ENCOUNTER — Encounter: Payer: Self-pay | Admitting: Nurse Practitioner

## 2018-02-13 ENCOUNTER — Ambulatory Visit (INDEPENDENT_AMBULATORY_CARE_PROVIDER_SITE_OTHER): Payer: Medicare Other | Admitting: Nurse Practitioner

## 2018-02-13 VITALS — BP 150/84 | HR 82 | Temp 98.1°F | Ht 61.0 in | Wt 125.4 lb

## 2018-02-13 DIAGNOSIS — R351 Nocturia: Secondary | ICD-10-CM

## 2018-02-13 DIAGNOSIS — R350 Frequency of micturition: Secondary | ICD-10-CM

## 2018-02-13 MED ORDER — SOLIFENACIN SUCCINATE 5 MG PO TABS: 5.0000 mg | ORAL_TABLET | Freq: Every day | ORAL | 0 refills | Status: DC

## 2018-02-13 NOTE — Patient Instructions (Addendum)
No need for oral abx at this time.  Urine culture was negative for bacteria.  Need to f/up with urology and pcp.  Start versicare at bedtime.   Overactive Bladder, Adult  Overactive bladder refers to a condition in which a person has a sudden need to pass urine. The person may leak urine if he or she cannot get to the bathroom fast enough (urinary incontinence). A person with this condition may also wake up several times in the night to go to the bathroom. Overactive bladder is associated with poor nerve signals between your bladder and your brain. Your bladder may get the signal to empty before it is full. You may also have very sensitive muscles that make your bladder squeeze too soon. These symptoms might interfere with daily work or social activities. What are the causes? This condition may be associated with or caused by:  Urinary tract infection.  Infection of nearby tissues, such as the prostate.  Prostate enlargement.  Surgery on the uterus or urethra.  Bladder stones, inflammation, or tumors.  Drinking too much caffeine or alcohol.  Certain medicines, especially medicines that get rid of extra fluid in the body (diuretics).  Muscle or nerve weakness, especially from: ? A spinal cord injury. ? Stroke. ? Multiple sclerosis. ? Parkinson's disease.  Diabetes.  Constipation. What increases the risk? You may be at greater risk for overactive bladder if you:  Are an older adult.  Smoke.  Are going through menopause.  Have prostate problems.  Have a neurological disease, such as stroke, dementia, Parkinson's disease, or multiple sclerosis (MS).  Eat or drink things that irritate the bladder. These include alcohol, spicy food, and caffeine.  Are overweight or obese. What are the signs or symptoms? Symptoms of this condition include:  Sudden, strong urge to urinate.  Leaking urine.  Urinating 8 or more times a day.  Waking up to urinate 2 or more times a  night. How is this diagnosed? Your health care provider may suspect overactive bladder based on your symptoms. He or she will diagnose this condition by:  A physical exam and medical history.  Blood or urine tests. You might need bladder or urine tests to help determine what is causing your overactive bladder. You might also need to see a health care provider who specializes in urinary tract problems (urologist). How is this treated? Treatment for overactive bladder depends on the cause of your condition and whether it is mild or severe. You can also make lifestyle changes at home. Options include:  Bladder training. This may include: ? Learning to control the urge to urinate by following a schedule that directs you to urinate at regular intervals (timed voiding). ? Doing Kegel exercises to strengthen your pelvic floor muscles, which support your bladder. Toning these muscles can help you control urination, even if your bladder muscles are overactive.  Special devices. This may include: ? Biofeedback, which uses sensors to help you become aware of your body's signals. ? Electrical stimulation, which uses electrodes placed inside the body (implanted) or outside the body. These electrodes send gentle pulses of electricity to strengthen the nerves or muscles that control the bladder. ? Women may use a plastic device that fits into the vagina and supports the bladder (pessary).  Medicines. ? Antibiotics to treat bladder infection. ? Antispasmodics to stop the bladder from releasing urine at the wrong time. ? Tricyclic antidepressants to relax bladder muscles. ? Injections of botulinum toxin type A directly into the bladder tissue  to relax bladder muscles.  Lifestyle changes. This may include: ? Weight loss. Talk to your health care provider about weight loss methods that would work best for you. ? Diet changes. This may include reducing how much alcohol and caffeine you consume, or drinking  fluids at different times of the day. ? Not smoking. Do not use any products that contain nicotine or tobacco, such as cigarettes and e-cigarettes. If you need help quitting, ask your health care provider.  Surgery. ? A device may be implanted to help manage the nerve signals that control urination. ? An electrode may be implanted to stimulate electrical signals in the bladder. ? A procedure may be done to change the shape of the bladder. This is done only in very severe cases. Follow these instructions at home: Lifestyle  Make any diet or lifestyle changes that are recommended by your health care provider. These may include: ? Drinking less fluid or drinking fluids at different times of the day. ? Cutting down on caffeine or alcohol. ? Doing Kegel exercises. ? Losing weight if needed. ? Eating a healthy and balanced diet to prevent constipation. This may include:  Eating foods that are high in fiber, such as fresh fruits and vegetables, whole grains, and beans.  Limiting foods that are high in fat and processed sugars, such as fried and sweet foods. General instructions  Take over-the-counter and prescription medicines only as told by your health care provider.  If you were prescribed an antibiotic medicine, take it as told by your health care provider. Do not stop taking the antibiotic even if you start to feel better.  Use any implants or pessary as told by your health care provider.  If needed, wear pads to absorb urine leakage.  Keep a journal or log to track how much and when you drink and when you feel the need to urinate. This will help your health care provider monitor your condition.  Keep all follow-up visits as told by your health care provider. This is important. Contact a health care provider if:  You have a fever.  Your symptoms do not get better with treatment.  Your pain and discomfort get worse.  You have more frequent urges to urinate. Get help right away  if:  You are not able to control your bladder. Summary  Overactive bladder refers to a condition in which a person has a sudden need to pass urine.  Several conditions may lead to an overactive bladder.  Treatment for overactive bladder depends on the cause and severity of your condition.  Follow your health care provider's instructions about lifestyle changes, doing Kegel exercises, keeping a journal, and taking medicines. This information is not intended to replace advice given to you by your health care provider. Make sure you discuss any questions you have with your health care provider. Document Released: 11/20/2008 Document Revised: 02/09/2017 Document Reviewed: 02/09/2017 Elsevier Interactive Patient Education  2019 [employer].

## 2018-02-13 NOTE — Progress Notes (Signed)
Subjective:  Patient ID: Sydney Lucas, female    DOB: [birth year]  Age: 78 y.o. MRN: [id number]  CC: Urinary Tract Infection (UTI no better, constant urge to urinate, denies burning, finished ax 1day ago)   Urinary Frequency   This is a recurrent problem. The current episode started 1 to 4 weeks ago. The problem occurs every urination. The problem has been unchanged. The pain is at a severity of 0/10. The patient is experiencing no pain. There has been no fever. She is not sexually active. There is no history of pyelonephritis. Associated symptoms include frequency and urgency. Pertinent negatives include no chills, discharge, flank pain, hematuria, hesitancy, nausea, possible pregnancy, sweats or vomiting.  symptoms are worse at night. No caffeine use. UA and urine culture done 1week ago: normal.  Reviewed past Medical, Social and Family history today.  Outpatient Medications Prior to Visit  Medication Sig Dispense Refill  . Biotin 1000 MCG tablet Take 1,000 mcg by mouth daily.      . Calcium Carbonate-Vit D-Min (CALTRATE 600+D PLUS) 600-400 MG-UNIT per tablet Take 1 tablet by mouth daily.      . citalopram (CELEXA) 10 MG tablet Take 1 tablet (10 mg total) by mouth daily. 30 tablet 2  . Flaxseed, Linseed, (FLAX SEED OIL) 1000 MG CAPS Take 1 capsule by mouth.    Marland Kitchen LORazepam (ATIVAN) 0.5 MG tablet Take 1 tablet (0.5 mg total) by mouth 2 (two) times daily as needed for anxiety. 30 tablet 0  . Multiple Vitamin (MULTIVITAMIN) tablet Take 1 tablet by mouth daily.      . vitamin B-12 (CYANOCOBALAMIN) 100 MCG tablet Take 50 mcg by mouth daily.      . vitamin C (ASCORBIC ACID) 500 MG tablet Take 500 mg by mouth daily.    . ciprofloxacin (CIPRO) 500 MG tablet Take 1 tablet (500 mg total) by mouth 2 (two) times daily. (Patient not taking: Reported on 02/13/2018) 6 tablet 0   No facility-administered medications prior to visit.     ROS See HPI  Objective:  BP (!) 150/84   Pulse 82    Temp 98.1 F (36.7 C) (Oral)   Ht 5\' 1"  (1.549 m)   Wt 125 lb 6.4 oz (56.9 kg)   SpO2 96%   BMI 23.69 kg/m   BP Readings from Last 3 Encounters:  02/13/18 (!) 150/84  02/09/18 (!) 160/80  02/06/18 128/76    Wt Readings from Last 3 Encounters:  02/13/18 125 lb 6.4 oz (56.9 kg)  02/09/18 126 lb (57.2 kg)  02/06/18 123 lb 9.6 oz (56.1 kg)    Physical Exam Vitals signs reviewed. Exam conducted with a chaperone present.  Abdominal:     General: Abdomen is flat.     Palpations: Abdomen is soft.  Genitourinary:    Labia:        Right: No rash or tenderness.        Left: No rash or tenderness.      Urethra: Prolapse present. No urethral pain, urethral swelling or urethral lesion.  Neurological:     Mental Status: She is alert.  Psychiatric:        Mood and Affect: Mood normal.        Behavior: Behavior normal.     Lab Results  Component Value Date   WBC 10.0 01/01/2018   HGB 12.8 01/01/2018   HCT 37.4 01/01/2018   PLT 387.0 01/01/2018   GLUCOSE 122 (H) 01/01/2018   CHOL 198 12/22/2016  TRIG 175.0 (H) 12/22/2016   HDL 55.40 12/22/2016   LDLDIRECT 110.7 08/16/2012   LDLCALC 107 (H) 12/22/2016   ALT 17 01/01/2018   AST 18 01/01/2018   NA 134 (L) 01/01/2018   K 4.4 01/01/2018   CL 98 01/01/2018   CREATININE 0.66 01/01/2018   BUN 15 01/01/2018   CO2 26 01/01/2018   TSH 1.41 01/01/2018    Mr Brain Wo Contrast  Result Date: 02/11/2018 CLINICAL DATA:  78 y/o  F; headache. EXAM: MRI HEAD WITHOUT CONTRAST TECHNIQUE: Multiplanar, multiecho pulse sequences of the brain and surrounding structures were obtained without intravenous contrast. COMPARISON:  None. FINDINGS: Brain: No acute infarction, hemorrhage, hydrocephalus, extra-axial collection or mass lesion. Single punctate focus of T2 FLAIR hyperintense signal abnormality within left parietal subcortical white matter of unlikely significance given age. No additional structural or signal abnormality of the brain  identified. Vascular: Normal flow voids. Skull and upper cervical spine: Normal marrow signal. Sinuses/Orbits: Negative. Other: None. IMPRESSION: No acute intracranial abnormality. Unremarkable MRI of the brain for age. Electronically Signed   By: Kristine Garbe M.D.   On: 02/11/2018 15:17    Assessment & Plan:   Sydney Lucas was seen today for urinary tract infection.  Diagnoses and all orders for this visit:  Urinary frequency -     Ambulatory referral to Urology -     solifenacin (VESICARE) 5 MG tablet; Take 1 tablet (5 mg total) by mouth at bedtime.  Nocturia -     solifenacin (VESICARE) 5 MG tablet; Take 1 tablet (5 mg total) by mouth at bedtime.   I have discontinued Sydney Lucas. Sydney Lucas's ciprofloxacin. I am also having her start on solifenacin. Additionally, I am having her maintain her CALTRATE 600+D PLUS, vitamin B-12, Biotin, multivitamin, vitamin C, Flax Seed Oil, LORazepam, and citalopram.  Meds ordered this encounter  Medications  . solifenacin (VESICARE) 5 MG tablet    Sig: Take 1 tablet (5 mg total) by mouth at bedtime.    Dispense:  14 tablet    Refill:  0    Order Specific Question:   Supervising Provider    Answer:   Lucille Passy [[id number]]    Problem List Items Addressed This Visit    None    Visit Diagnoses    Urinary frequency    -  Primary   Relevant Medications   solifenacin (VESICARE) 5 MG tablet   Other Relevant Orders   Ambulatory referral to Urology   Nocturia       Relevant Medications   solifenacin (VESICARE) 5 MG tablet       Follow-up: No follow-ups on file.  Wilfred Lacy, NP

## 2018-02-13 NOTE — Telephone Encounter (Signed)
Pt. Reports she finished her antibiotic for UTI yesterday. Last night started having urgency "all through the night." No other symptoms. No availability at [facility].Appointment made at [facility] for today.  Reason for Disposition . Urinating more frequently than usual (i.e., frequency)  Answer Assessment - Initial Assessment Questions 1. SYMPTOM: "What's the main symptom you're concerned about?" (e.g., frequency, incontinence)     Urgency 2. ONSET: "When did the  urgency  start?"     Last night 3. PAIN: "Is there any pain?" If so, ask: "How bad is it?" (Scale: 1-10; mild, moderate, severe)     No 4. CAUSE: "What do you think is causing the symptoms?"     UTI 5. OTHER SYMPTOMS: "Do you have any other symptoms?" (e.g., fever, flank pain, blood in urine, pain with urination)     No 6. PREGNANCY: "Is there any chance you are pregnant?" "When was your last menstrual period?"     n/a  Protocols used: URINARY [facility]

## 2018-02-13 NOTE — Progress Notes (Signed)
Sydney Lucas DOB: 01/16/1941 Encounter date: 02/14/2018  This is a 78 y.o. female who presents with Chief Complaint  Patient presents with  . Follow-up    History of present illness: At last visit we started low dose citalopram to help with anxiety symptoms.   MRI brain appeared normal.   Started having constant urge to urinate. Found out yesterday no bacteria in urine. 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    After 3 days of sx getting worse, up every 30 minutes all night long. Ended up seeing second provider here. Put on abx course. Started this Friday night and did ok Friday. Saturday night up all night and difficult weekend. Saw doc at [facility] yesterday and dx with prolapsed bladder. Was hesitant to take the vesicare provided due to intolerance of other medications. [facility] referred [facility]. Last time she saw gynecology was in early 71's.   Bladder issues have caused more anxiety. Taking half tablet of citalopram at this point. No headache at this point. Not had any panic attacks. Did take lorazepam yesterday over bladder issue. Thinks it helps to calm bladder. Had little nausea at first, but tolerating now.   Hasn't been able to go back to physical therapy at this point, but trying to do home exercises.  Allergies  Allergen Reactions  . Imipramine Nausea Only and Anxiety    Blurred vision, increased heart rate,   . Penicillins     REACTION: rash  . Tramadol Anxiety   Current Meds  Medication Sig  . Biotin 1000 MCG tablet Take 1,000 mcg by mouth daily.    . Calcium Carbonate-Vit D-Min (CALTRATE 600+D PLUS) 600-400 MG-UNIT per tablet Take 1 tablet by mouth daily.    . citalopram (CELEXA) 10 MG tablet Take 1 tablet (10 mg total) by mouth daily.  . Flaxseed, Linseed, (FLAX SEED OIL) 1000 MG CAPS Take 1 capsule by mouth.  Marland Kitchen LORazepam (ATIVAN) 0.5 MG tablet Take 1 tablet (0.5 mg total) by mouth 2 (two) times daily as needed for anxiety.  . Multiple Vitamin  (MULTIVITAMIN) tablet Take 1 tablet by mouth daily.    . solifenacin (VESICARE) 5 MG tablet Take 1 tablet (5 mg total) by mouth at bedtime.  . vitamin B-12 (CYANOCOBALAMIN) 100 MCG tablet Take 50 mcg by mouth daily.    . vitamin C (ASCORBIC ACID) 500 MG tablet Take 500 mg by mouth daily.    Review of Systems  Constitutional: Negative for chills, fatigue and fever.  Respiratory: Negative for cough, chest tightness, shortness of breath and wheezing.   Cardiovascular: Negative for chest pain, palpitations and leg swelling.  Genitourinary: Positive for frequency. Negative for difficulty urinating, dysuria and hematuria.  Psychiatric/Behavioral: Positive for sleep disturbance. Nervous/anxious: improvement noted.     Objective:  BP (!) 142/70 (BP Location: Left Arm, Patient Position: Sitting, Cuff Size: Normal)   Pulse 82   Temp 98.2 F (36.8 C) (Oral)   Wt 124 lb 6.4 oz (56.4 kg)   SpO2 96%   BMI 23.51 kg/m   Weight: 124 lb 6.4 oz (56.4 kg)   BP Readings from Last 3 Encounters:  02/14/18 (!) 142/70  02/13/18 (!) 150/84  02/09/18 (!) 160/80   Wt Readings from Last 3 Encounters:  02/14/18 124 lb 6.4 oz (56.4 kg)  02/13/18 125 lb 6.4 oz (56.9 kg)  02/09/18 126 lb (57.2 kg)    Physical Exam Constitutional:      General: She is not in acute distress.  Appearance: She is well-developed.  Cardiovascular:     Rate and Rhythm: Normal rate and regular rhythm.     Heart sounds: Normal heart sounds. No murmur. No friction rub.     Comments: No lower extremity edema Pulmonary:     Effort: Pulmonary effort is normal. No respiratory distress.     Breath sounds: Normal breath sounds. No wheezing or rales.  Abdominal:     Tenderness: There is no abdominal tenderness. There is no right CVA tenderness or left CVA tenderness.  Neurological:     Mental Status: She is alert and oriented to person, place, and time.  Psychiatric:        Behavior: Behavior normal.      Assessment/Plan  1. Urinary urgency I did not repeat exam today, but I do feel with ongoing symptoms and concern for prolapse at recent visit that urology eval is needed. She worries about med side effects so we will try pyridium first and work on low irritant diet and then she will try vesicare at bedtime if not improving.  2. Anxiety Continue 1/2 tab citalopram. Consider increase in a month. .  Return in about 1 month (around 03/17/2018).      Micheline Rough, MD

## 2018-02-14 ENCOUNTER — Ambulatory Visit: Payer: Medicare Other | Admitting: Family Medicine

## 2018-02-14 ENCOUNTER — Ambulatory Visit (INDEPENDENT_AMBULATORY_CARE_PROVIDER_SITE_OTHER): Payer: Medicare Other | Admitting: Family Medicine

## 2018-02-14 ENCOUNTER — Encounter: Payer: Self-pay | Admitting: Family Medicine

## 2018-02-14 VITALS — BP 142/70 | HR 82 | Temp 98.2°F | Wt 124.4 lb

## 2018-02-14 DIAGNOSIS — F419 Anxiety disorder, unspecified: Secondary | ICD-10-CM | POA: Diagnosis not present

## 2018-02-14 DIAGNOSIS — R3915 Urgency of urination: Secondary | ICD-10-CM

## 2018-02-14 MED ORDER — PHENAZOPYRIDINE HCL 100 MG PO TABS: 100.0000 mg | ORAL_TABLET | Freq: Three times a day (TID) | ORAL | 0 refills | Status: DC | PRN

## 2018-02-14 NOTE — Patient Instructions (Addendum)
Look over list of bladder-irritating foods/drinks and try to avoid.   Keep hydrated with water.   Trial of pyridium 100mg  (take 1 tablet three time daily but if no relief noted with single tablet then take 2 tablets 3 times daily). Do not do this for longer than 3 days.   If symptoms persist after pyridium trial, then ok to try vesicare. Ok to try at bedtime.

## 2018-02-21 ENCOUNTER — Encounter: Payer: Self-pay | Admitting: Family Medicine

## 2018-02-22 ENCOUNTER — Encounter: Payer: Self-pay | Admitting: Family Medicine

## 2018-02-23 ENCOUNTER — Other Ambulatory Visit: Payer: Self-pay | Admitting: Family Medicine

## 2018-02-23 ENCOUNTER — Ambulatory Visit: Payer: Self-pay

## 2018-02-23 ENCOUNTER — Telehealth: Payer: Self-pay

## 2018-02-23 MED ORDER — BUSPIRONE HCL 5 MG PO TABS: 5.0000 mg | ORAL_TABLET | Freq: Two times a day (BID) | ORAL | 0 refills | Status: DC

## 2018-02-23 NOTE — Telephone Encounter (Signed)
Copied from [place] [id number]. Topic: General - Other >> Feb 23, 2018  9:37 AM [name] wrote: Reason for CRM: Pt called in and wanted to let Dr Ethlyn Gallery know she really would like to get off the LORazepam (ATIVAN) 0.5 MG tablet [[id number]] all together. She would like to be able to stop taking this med all together.  She would like Dr Ethlyn Gallery to work with her to come off of this med   FYI

## 2018-02-23 NOTE — Telephone Encounter (Signed)
Husband called to report Monday pt. Started to taper up her Celexa as ordered, and immediately started having "severe" side effects : Nausea, fear, panic, dread, headache. States she can not take it and wants to taper back off, or stop the medication if possible.Please advise pt.Contact number [phone].  Answer Assessment - Initial Assessment Questions 1. SYMPTOMS: "Do you have any symptoms?"     Nausea,weakness,fear,dread,fear,headache,panic 2. SEVERITY: If symptoms are present, ask "Are they mild, moderate or severe?"      Severe  Protocols used: MEDICATION QUESTION CALL-A-AH

## 2018-02-23 NOTE — Telephone Encounter (Signed)
Patient stated she will go back to the 1/2 tablet but would like to know if there is an alternative medication she could try. Patient has also sent in a mychart message that is a little more detailed.  Please advise.

## 2018-02-23 NOTE — Telephone Encounter (Signed)
Sorry I didn't see all these messages. The lorazepam is our back up. I sent message regarding trial of buspar for anxiety for her and we will try to get her on something to help control her baseline anxiety so that she does not need the lorazepam. In meanwhile, it is our emergency tool.

## 2018-02-23 NOTE — Telephone Encounter (Signed)
Discussed notes with the patient, patient expressed understanding. Nothing further needed. Rx has been sent in.

## 2018-02-23 NOTE — Telephone Encounter (Signed)
Have her go back to the half tablet as she was before and tolerating well. If she wants to come off medication let me know.

## 2018-02-23 NOTE — Telephone Encounter (Signed)
Dr. Koberlein please advise  

## 2018-02-23 NOTE — Telephone Encounter (Signed)
I sent her a message back, but I'm sure she would appreciate call if you have time. My alternative suggestion is buspirone. I would recommend starting 5mg  tab BID x 2 weeks and then f/u appointment. She would be ok to just stop the citalopram when she starts this since she has been on a low dose. If she is not noting improvement with the buspirone in a week but is tolerating we can increase to 1.5 tabs (7.5mg ) BID.

## 2018-02-23 NOTE — Telephone Encounter (Signed)
Discussed recommendations with patient, patient expressed understanding. Nothing further needed.

## 2018-02-26 ENCOUNTER — Telehealth: Payer: Self-pay | Admitting: Family Medicine

## 2018-02-26 NOTE — Telephone Encounter (Signed)
Copied from [place] [id number]. Topic: General - Other >> Feb 26, 2018  7:32 AM Lennox Solders wrote: Brynda Greathouse for CRM: pt is calling she started taking buspar 02-23-2018 that evening and on 02-26-18 she woke up about 1 am in panic and very nervous her mind was racing. Please advise. Pt would like to know if she should continue the medication

## 2018-02-28 NOTE — Telephone Encounter (Signed)
Saw psychiatry yesterday. They changed her medication at the appointment to mirtazapine and venlafaxine ER. Patient states she is having trouble with this medication as well. Stated that she felt "whiped" out this morning.

## 2018-02-28 NOTE — Telephone Encounter (Signed)
Patient is aware. Stated that she will stick with these medications for now and see how she adjust to them.

## 2018-02-28 NOTE — Telephone Encounter (Signed)
The mirtazapine is generally helpful for sleep, but could make her a little tired feeling the next day. I would encourage her to ask psychiatry about side effects and let them manage at this point. They may be able to make small adjustments or have her do lower doses short term to help with easing into medication changes.   I think that the venlafaxine will be a good choice for her for the anxiety, but again it might just take some time to get adjusted to medication. Let me know if I can help, but I think psychiatry has made some good choices.

## 2018-02-28 NOTE — Telephone Encounter (Signed)
Ria Comment - please check in with her by phone today. See how she is feeling. I hate to switch medications frequently because it does take some time to adjust to these medications, but I also don't want her suffering along the way. See how yesterday and evening were for her and how she is doing now and we can work on adjusting pending this feedback.

## 2018-03-07 ENCOUNTER — Ambulatory Visit (INDEPENDENT_AMBULATORY_CARE_PROVIDER_SITE_OTHER): Payer: Medicare Other | Admitting: Orthopedic Surgery

## 2018-03-27 ENCOUNTER — Encounter: Payer: Self-pay | Admitting: Adult Health

## 2018-03-27 ENCOUNTER — Ambulatory Visit: Payer: Self-pay | Admitting: *Deleted

## 2018-03-27 ENCOUNTER — Ambulatory Visit (INDEPENDENT_AMBULATORY_CARE_PROVIDER_SITE_OTHER): Payer: Medicare Other | Admitting: Adult Health

## 2018-03-27 VITALS — BP 138/80 | Temp 98.2°F | Wt 121.0 lb

## 2018-03-27 DIAGNOSIS — R410 Disorientation, unspecified: Secondary | ICD-10-CM

## 2018-03-27 DIAGNOSIS — G43909 Migraine, unspecified, not intractable, without status migrainosus: Secondary | ICD-10-CM

## 2018-03-27 LAB — POC URINALSYSI DIPSTICK (AUTOMATED)
Bilirubin, UA: NEGATIVE
Glucose, UA: NEGATIVE
Leukocytes, UA: NEGATIVE
Nitrite, UA: NEGATIVE
Protein, UA: POSITIVE — AB
Spec Grav, UA: 1.02
Urobilinogen, UA: 0.2 U/dL
pH, UA: 6

## 2018-03-27 NOTE — Telephone Encounter (Signed)
Called in c/o having a 30 minute episode of confusion yesterday.   Her BP has been running high.   Not on medication.   It's being monitored but it's been running high lately.   I made her an appt with Dorothyann Peng for today at 11:15.   Dr. Ethlyn Gallery was not available until Friday.   She requested Tommi Rumps.  Triage note sent to the office.  Reason for Disposition . [4] Systolic BP  >= 403 OR Diastolic >= 80 AND [4] not taking BP medications  Answer Assessment - Initial Assessment Questions 1. BLOOD PRESSURE: "What is the blood pressure?" "Did you take at least two measurements 5 minutes apart?"     160/96 at 5:00am this morning. 2. ONSET: "When did you take your blood pressure?"     5:00am this morning 3. HOW: "How did you obtain the blood pressure?" (e.g., visiting nurse, automatic home BP monitor)     Automatic BP cuff at home. 4. HISTORY: "Do you have a history of high blood pressure?"     Yes but not on medication.   They are watching it close but it's staying elevated. 5. MEDICATIONS: "Are you taking any medications for blood pressure?" "Have you missed any doses recently?"     N/A 6. OTHER SYMPTOMS: "Do you have any symptoms?" (e.g., headache, chest pain, blurred vision, difficulty breathing, weakness)     Confusion yesterday.   Suddenly felt bad and confused.   Incoherant.   My husband was talking to me and I couldn't understand him for about 30 minutes.   I laid down and took a nap.   After getting up I was fine except for having a headache.   I still have a mild headache this morning.    My speech was not slurred and was correct per my husband yesterday. 7. PREGNANCY: "Is there any chance you are pregnant?" "When was your last menstrual period?"     Not asked due to age  Protocols used: [place]

## 2018-03-27 NOTE — Progress Notes (Signed)
Subjective:    Patient ID: Sydney Lucas, female    DOB: 1940/09/24, 78 y.o.   MRN: [id number]  HPI 78 year old female who  has a past medical history of Headache(784.0).  She is a patient of Dr. Ethlyn Gallery, who is being seen today for elevated blood pressure readings at home and yesterday afternoon had a momentarily episode of confusion that lasted about an hour. Her husband who is with her at this visit reports that Steph seemed to be in a " fog and wiped out." She does report having a headache right before the episodes and headache continues throughout the episode until she woke up from a nap, at which time she felt normal. Headache was located behind bilateral eyes.  She does have a history of headaches, she is unsure if they are migraine headaches   Denies slurred speech, blurred vision,  or facial droop.     She has not been checking her blood pressure at home.   BP Readings from Last 3 Encounters:  03/27/18 138/80  02/14/18 (!) 142/70  02/13/18 (!) 150/84    Review of Systems See HPI   Past Medical History:  Diagnosis Date  . Headache(784.0)     Social History   Socioeconomic History  . Marital status: Married    Spouse name: Not on file  . Number of children: Not on file  . Years of education: Not on file  . Highest education level: Not on file  Occupational History  . Not on file  Social Needs  . Financial resource strain: Not on file  . Food insecurity:    Worry: Not on file    Inability: Not on file  . Transportation needs:    Medical: Not on file    Non-medical: Not on file  Tobacco Use  . Smoking status: Former Smoker    Last attempt to quit: 02/08/1968    Years since quitting: 50.1  . Smokeless tobacco: Never Used  Substance and Sexual Activity  . Alcohol use: No  . Drug use: No  . Sexual activity: Not on file  Lifestyle  . Physical activity:    Days per week: Not on file    Minutes per session: Not on file  . Stress: Not on file    Relationships  . Social connections:    Talks on phone: Not on file    Gets together: Not on file    Attends religious service: Not on file    Active member of club or organization: Not on file    Attends meetings of clubs or organizations: Not on file    Relationship status: Not on file  . Intimate partner violence:    Fear of current or ex partner: Not on file    Emotionally abused: Not on file    Physically abused: Not on file    Forced sexual activity: Not on file  Other Topics Concern  . Not on file  Social History Narrative  . Not on file    History reviewed. No pertinent surgical history.  Family History  Problem Relation Age of Onset  . Diabetes Father   . CAD Father     Allergies  Allergen Reactions  . Imipramine Nausea Only and Anxiety    Blurred vision, increased heart rate,   . Penicillins     REACTION: rash  . Tramadol Anxiety    Current Outpatient Medications on File Prior to Visit  Medication Sig Dispense Refill  . Biotin  1000 MCG tablet Take 1,000 mcg by mouth daily.      . Calcium Carbonate-Vit D-Min (CALTRATE 600+D PLUS) 600-400 MG-UNIT per tablet Take 1 tablet by mouth daily.      . Flaxseed, Linseed, (FLAX SEED OIL) 1000 MG CAPS Take 1 capsule by mouth.    Marland Kitchen LORazepam (ATIVAN) 0.5 MG tablet Take 1 tablet (0.5 mg total) by mouth 2 (two) times daily as needed for anxiety. 30 tablet 0  . Multiple Vitamin (MULTIVITAMIN) tablet Take 1 tablet by mouth daily.      Marland Kitchen MYRBETRIQ 50 MG TB24 tablet Take 1 tablet by mouth once daily    . PARoxetine (PAXIL) 10 MG tablet TK 1 T PO QD    . vitamin B-12 (CYANOCOBALAMIN) 100 MCG tablet Take 50 mcg by mouth daily.      . vitamin C (ASCORBIC ACID) 500 MG tablet Take 500 mg by mouth daily.     No current facility-administered medications on file prior to visit.     BP 138/80 (BP Location: Left Arm, Cuff Size: Normal)   Temp 98.2 F (36.8 C)   Wt 121 lb (54.9 kg)   BMI 22.86 kg/m       Objective:    Physical Exam Vitals signs and nursing note reviewed.  Constitutional:      Appearance: Normal appearance.  HENT:     Mouth/Throat:     Mouth: Mucous membranes are moist.     Pharynx: Oropharynx is clear.  Eyes:     Extraocular Movements: Extraocular movements intact.     Pupils: Pupils are equal, round, and reactive to light.  Cardiovascular:     Rate and Rhythm: Normal rate and regular rhythm.     Pulses: Normal pulses.     Heart sounds: Normal heart sounds.  Skin:    General: Skin is dry.  Neurological:     General: No focal deficit present.     Mental Status: She is alert and oriented to person, place, and time.     Cranial Nerves: No cranial nerve deficit.     Sensory: No sensory deficit.     Motor: No weakness, tremor, abnormal muscle tone or pronator drift.     Coordination: Coordination is intact. Coordination normal. Finger-Nose-Finger Test and Heel to [facility] Test normal.     Gait: Gait is intact. Gait and tandem walk normal.     Deep Tendon Reflexes: Reflexes normal.  Psychiatric:        Mood and Affect: Mood normal.        Behavior: Behavior normal.        Thought Content: Thought content normal.        Judgment: Judgment normal.       Assessment & Plan:  No concern for acute CVA, can not r/o TIA but appears to be more of headache caused. BP at goal for age during this office visit. Advised to monitor at home and bring log to PCP at next visit. Return precautions reviewed   Dorothyann Peng, NP

## 2018-03-27 NOTE — Telephone Encounter (Signed)
Noted  

## 2018-04-03 ENCOUNTER — Other Ambulatory Visit: Payer: Self-pay | Admitting: Family Medicine

## 2018-04-03 NOTE — Telephone Encounter (Signed)
Last fill 01/24/18 Last OV 02/14/2018  Ok to fill?

## 2018-04-04 NOTE — Telephone Encounter (Signed)
How is she doing overall? How is anxiety?

## 2018-07-24 ENCOUNTER — Encounter: Payer: Self-pay | Admitting: Adult Health

## 2018-07-25 ENCOUNTER — Other Ambulatory Visit: Payer: Self-pay | Admitting: Adult Health

## 2018-10-23 ENCOUNTER — Encounter: Payer: Self-pay | Admitting: Adult Health

## 2018-10-27 ENCOUNTER — Encounter: Payer: Self-pay | Admitting: Family Medicine

## 2018-11-01 ENCOUNTER — Ambulatory Visit (INDEPENDENT_AMBULATORY_CARE_PROVIDER_SITE_OTHER): Payer: Medicare Other

## 2018-11-01 ENCOUNTER — Other Ambulatory Visit: Payer: Self-pay

## 2018-11-01 DIAGNOSIS — Z23 Encounter for immunization: Secondary | ICD-10-CM

## 2018-11-01 NOTE — Progress Notes (Signed)
Patient in clinic for flu vaccine and Prevnar 13. Vaccines administered with no adverse reaction.

## 2018-11-05 ENCOUNTER — Ambulatory Visit: Payer: Self-pay

## 2018-11-05 NOTE — Telephone Encounter (Signed)
Pt husband called for his wife. He states that she woke up today unsteady and dizzy. Temp 97 BP 139/86. Heart rate normal per patient report. She reports Hx of vertigo in the past but states this is lightheadedness. She denies cold symptoms, dehydration. She reports eating and drinking normally yesterday. Her symptoms are not made worse with movement. Care advice read to patient husband. He verbalized understanding. Call transferred to office for scheduling advice.  Reason for Disposition . [1] MODERATE dizziness (e.g., interferes with normal activities) AND [2] has NOT been evaluated by physician for this  (Exception: dizziness caused by heat exposure, sudden standing, or poor fluid intake)  Answer Assessment - Initial Assessment Questions 1. DESCRIPTION: "Describe your dizziness."     Light headed 2. LIGHTHEADED: "Do you feel lightheaded?" (e.g., somewhat faint, woozy, weak upon standing)    Need assistance to stand 3. VERTIGO: "Do you feel like either you or the room is spinning or tilting?" (i.e. vertigo)     No 4. SEVERITY: "How bad is it?"  "Do you feel like you are going to faint?" "Can you stand and walk?"   - MILD - walking normally   - MODERATE - interferes with normal activities (e.g., work, school)    - SEVERE - unable to stand, requires support to walk, feels like passing out now.      Unable to stand  Requires support to walk 5. ONSET:  "When did the dizziness begin?"     About 5 am today 6. AGGRAVATING FACTORS: "Does anything make it worse?" (e.g., standing, change in head position)    no 7. HEART RATE: "Can you tell me your heart rate?" "How many beats in 15 seconds?"  (Note: not all patients can do this)       normal 8. CAUSE: "What do you think is causing the dizziness?"     unsure 9. RECURRENT SYMPTOM: "Have you had dizziness before?" If so, ask: "When was the last time?" "What happened that time?"    Vertigo from ear issue 10. OTHER SYMPTOMS: "Do you have any other  symptoms?" (e.g., fever, chest pain, vomiting, diarrhea, bleeding)       no 11. PREGNANCY: "Is there any chance you are pregnant?" "When was your last menstrual period?"       N/A  Protocols used: DIZZINESS [facility]

## 2018-11-05 NOTE — Telephone Encounter (Signed)
Please advise on recommendations for pt to be evaluated. We have no openings in clinic today. Thank you!

## 2018-11-05 NOTE — Telephone Encounter (Signed)
Pt has been called and scheduled.

## 2018-11-05 NOTE — Telephone Encounter (Signed)
Per Dr Ethlyn Gallery she cannot add the pt in today.  Message sent to [facility].

## 2018-11-05 NOTE — Telephone Encounter (Signed)
Per [id number] message sent to clinical staff for advice.

## 2018-11-06 ENCOUNTER — Telehealth (INDEPENDENT_AMBULATORY_CARE_PROVIDER_SITE_OTHER): Payer: Medicare Other | Admitting: Family Medicine

## 2018-11-06 ENCOUNTER — Other Ambulatory Visit: Payer: Self-pay

## 2018-11-06 ENCOUNTER — Encounter: Payer: Self-pay | Admitting: Family Medicine

## 2018-11-06 DIAGNOSIS — R42 Dizziness and giddiness: Secondary | ICD-10-CM

## 2018-11-06 DIAGNOSIS — R197 Diarrhea, unspecified: Secondary | ICD-10-CM | POA: Diagnosis not present

## 2018-11-06 NOTE — Patient Instructions (Signed)
-  Imodium if needed if any further diarrhea  -stay hydrated and eat a bland dairy free diet  -I am glad you are feeling better  Seek care promptly if your symptoms worsen or recur, new concerns arise or you are not continuing to improve with treatment.

## 2018-11-06 NOTE — Progress Notes (Signed)
Virtual Visit via Telephone Note  I connected with Sydney Lucas on 11/06/18 at 10:20 AM EDT by telephone and verified that I am speaking with the correct person using two identifiers.   I discussed the limitations, risks, security and privacy concerns of performing an evaluation and management service by telephone and the availability of in person appointments. I also discussed with the patient that there may be a patient responsible charge related to this service. The patient expressed understanding and agreed to proceed.  Location patient: home Location provider: work or home office Participants present for the call: patient, provider Patient did not have a visit in the prior 7 days to address this/these issue(s).   History of Present Illness:  Phone call to discuss symptoms she had yesterday morning. Reports as soon as she stood up in the morning to get up she felt "swimmy headed" and felt unwell. This was brief. Then she had some watery diarrhea several times yesterday and once today. Mild stomach upset. Denies vomiting, HA, vision changes, weakness, numbness, SOB, CP, fevers, cough, congestion. Denies any sick contacts, or many exposures. Reports they are staying isolated given the pandemic. Reports has been feeling better today and has had no further episodes of feeling swimmy headed.   Observations/Objective: Patient sounds cheerful and well on the phone. I do not appreciate any SOB. Speech and thought processing are grossly intact. Patient reported vitals:  Assessment and Plan:  Diarrhea Lightheaded  -we discussed possible serious and likely etiologies, options for evaluation and workup, limitations of telemedicine visit vs in person visit, treatment, treatment risks and precautions. Pt prefers to treat via telemedicine empirically rather then risking or undertaking an in person visit at this moment. Suspect mild stomach bug vs other. She feels she is doing much better  today and opted for imodium if needed, hydration oral, no dairy for 1 week and prompt in person eval if any further dizziness or re-eval if worsening or not resolved over the next few days.Patient agrees to seek prompt in person care if worsening, new symptoms arise, or if is not improving with treatment.   Follow Up Instructions:  As needed for this complaint per above. Also advised regular follow up or CPE with PCP as appears to be due. Sent message to schedulers.  I did not refer this patient for an OV in the next 24 hours for this/these issue(s).  I discussed the assessment and treatment plan with the patient. The patient was provided an opportunity to ask questions and all were answered. The patient agreed with the plan and demonstrated an understanding of the instructions.   The patient was advised to call back or seek an in-person evaluation if the symptoms worsen or if the condition fails to improve as anticipated.  I provided 12 minutes of non-face-to-face time during this encounter.   Lucretia Kern, DO

## 2018-12-12 ENCOUNTER — Encounter: Payer: Self-pay | Admitting: Family Medicine

## 2019-02-14 ENCOUNTER — Encounter: Payer: Self-pay | Admitting: Family Medicine

## 2019-02-20 ENCOUNTER — Ambulatory Visit: Payer: Medicare Other | Attending: Internal Medicine

## 2019-02-20 ENCOUNTER — Encounter: Payer: Medicare Other | Admitting: Family Medicine

## 2019-02-20 DIAGNOSIS — Z23 Encounter for immunization: Secondary | ICD-10-CM

## 2019-02-20 NOTE — Progress Notes (Signed)
   Covid-19 Vaccination Clinic  Name:  AVALEIGH SHELLHAMMER    MRN: [id number] DOB: 25-Dec-1940  02/20/2019  [name] was observed post Covid-19 immunization for 15 minutes without incidence. She was provided with Vaccine Information Sheet and instruction to access the V-Safe system.   Ms. Jinkerson was instructed to call 911 with any severe reactions post vaccine: Marland Kitchen Difficulty breathing  . Swelling of your face and throat  . A fast heartbeat  . A bad rash all over your body  . Dizziness and weakness    Immunizations Administered    Name Date Dose VIS Date Route   Pfizer COVID-19 Vaccine 02/20/2019 11:55 AM 0.3 mL 01/18/2019 Intramuscular   Manufacturer: [place]   Lot: [id number]   [place]: [id number]

## 2019-03-12 ENCOUNTER — Ambulatory Visit: Payer: Medicare Other | Attending: Internal Medicine

## 2019-03-12 DIAGNOSIS — Z23 Encounter for immunization: Secondary | ICD-10-CM | POA: Insufficient documentation

## 2019-03-12 NOTE — Progress Notes (Signed)
   Covid-19 Vaccination Clinic  Name:  Sydney Lucas    MRN: [id number] DOB: January 30, 1941  03/12/2019  [name] was observed post Covid-19 immunization for 15 minutes without incidence. She was provided with Vaccine Information Sheet and instruction to access the V-Safe system.   [name] was instructed to call 911 with any severe reactions post vaccine: Marland Kitchen Difficulty breathing  . Swelling of your face and throat  . A fast heartbeat  . A bad rash all over your body  . Dizziness and weakness    Immunizations Administered    Name Date Dose VIS Date Route   Pfizer COVID-19 Vaccine 03/12/2019 10:14 AM 0.3 mL 01/18/2019 Intramuscular   Manufacturer: [place]   Lot: [id number]   [place]: [id number]

## 2019-07-25 ENCOUNTER — Other Ambulatory Visit: Payer: Self-pay

## 2019-07-26 ENCOUNTER — Encounter: Payer: Self-pay | Admitting: Family Medicine

## 2019-07-26 ENCOUNTER — Ambulatory Visit (INDEPENDENT_AMBULATORY_CARE_PROVIDER_SITE_OTHER): Payer: Medicare Other | Admitting: Family Medicine

## 2019-07-26 VITALS — BP 140/90 | HR 72 | Temp 97.9°F | Ht 62.0 in | Wt 134.8 lb

## 2019-07-26 DIAGNOSIS — E871 Hypo-osmolality and hyponatremia: Secondary | ICD-10-CM | POA: Diagnosis not present

## 2019-07-26 DIAGNOSIS — R238 Other skin changes: Secondary | ICD-10-CM | POA: Diagnosis not present

## 2019-07-26 DIAGNOSIS — Z13220 Encounter for screening for lipoid disorders: Secondary | ICD-10-CM | POA: Diagnosis not present

## 2019-07-26 DIAGNOSIS — Z0000 Encounter for general adult medical examination without abnormal findings: Secondary | ICD-10-CM | POA: Diagnosis not present

## 2019-07-26 DIAGNOSIS — R233 Spontaneous ecchymoses: Secondary | ICD-10-CM

## 2019-07-26 DIAGNOSIS — E2839 Other primary ovarian failure: Secondary | ICD-10-CM

## 2019-07-26 DIAGNOSIS — Z1231 Encounter for screening mammogram for malignant neoplasm of breast: Secondary | ICD-10-CM

## 2019-07-26 DIAGNOSIS — Z131 Encounter for screening for diabetes mellitus: Secondary | ICD-10-CM

## 2019-07-26 LAB — COMPREHENSIVE METABOLIC PANEL
ALT: 15 U/L (ref 0–35)
AST: 20 U/L (ref 0–37)
Albumin: 4.5 g/dL (ref 3.5–5.2)
Alkaline Phosphatase: 89 U/L (ref 39–117)
BUN: 10 mg/dL (ref 6–23)
CO2: 27 mEq/L (ref 19–32)
Calcium: 9.9 mg/dL (ref 8.4–10.5)
Chloride: 98 mEq/L (ref 96–112)
Creatinine, Ser: 0.73 mg/dL (ref 0.40–1.20)
GFR: 76.59 mL/min (ref >60.00)
Glucose, Bld: 102 mg/dL — ABNORMAL HIGH (ref 70–99)
Potassium: 4.3 mEq/L (ref 3.5–5.1)
Sodium: 131 mEq/L — ABNORMAL LOW (ref 135–145)
Total Bilirubin: 0.9 mg/dL (ref 0.2–1.2)
Total Protein: 7.5 g/dL (ref 6.0–8.3)

## 2019-07-26 LAB — HEMOGLOBIN A1C: Hgb A1c MFr Bld: 6.2 % (ref 4.6–6.5)

## 2019-07-26 LAB — CBC WITH DIFFERENTIAL/PLATELET
Basophils Absolute: 0.1 10*3/uL (ref 0.0–0.1)
Basophils Relative: 1.3 % (ref 0.0–3.0)
Eosinophils Absolute: 0.2 10*3/uL (ref 0.0–0.7)
Eosinophils Relative: 2.4 % (ref 0.0–5.0)
HCT: 36.7 % (ref 36.0–46.0)
Hemoglobin: 12.3 g/dL (ref 12.0–15.0)
Lymphocytes Relative: 31.8 % (ref 12.0–46.0)
Lymphs Abs: 2.4 10*3/uL (ref 0.7–4.0)
MCHC: 33.5 g/dL (ref 30.0–36.0)
MCV: 87.1 fl (ref 78.0–100.0)
Monocytes Absolute: 0.7 10*3/uL (ref 0.1–1.0)
Monocytes Relative: 9.6 % (ref 3.0–12.0)
Neutro Abs: 4.2 10*3/uL (ref 1.4–7.7)
Neutrophils Relative %: 54.9 % (ref 43.0–77.0)
Platelets: 345 10*3/uL (ref 150.0–400.0)
RBC: 4.21 Mil/uL (ref 3.87–5.11)
RDW: 13.9 % (ref 11.5–15.5)
WBC: 7.6 10*3/uL (ref 4.0–10.5)

## 2019-07-26 LAB — LIPID PANEL
Cholesterol: 198 mg/dL (ref 0–200)
HDL: 47.3 mg/dL (ref >39.00)
LDL Cholesterol: 119 mg/dL — ABNORMAL HIGH (ref 0–99)
NonHDL: 151.02
Total CHOL/HDL Ratio: 4
Triglycerides: 159 mg/dL — ABNORMAL HIGH (ref 0.0–149.0)
VLDL: 31.8 mg/dL (ref 0.0–40.0)

## 2019-07-26 NOTE — Addendum Note (Signed)
Addended by: Marrion Coy on: 07/26/2019 08:50 AM   Modules accepted: Orders

## 2019-07-26 NOTE — Progress Notes (Signed)
Patient: Sydney Lucas, Female    DOB: 1941/01/31, 79 y.o.   MRN: [id number] Visit Date: 07/26/2019  Today's Provider: Micheline Rough, MD   Chief Complaint  Patient presents with  . Annual Exam   Subjective:   Initial preventative physical exam  Sydney Lucas  is a 79 y.o. female who presents today for her annual wellness exam.  She feels well. She reports exercising daily. She reports she is sleeping well.  Feels very stiff when getting up from sleeping/sitting. Knee bothering her. Left knee. Has followed with ortho. Given brace that she couldn't wear. Riding exercise bike daily. Not sure what flared it, but is feeling a little better. Sometimes uses ice, takes ibuprofen on occasion.   Anxiety: not feeling anxious.  GERD: controlled with the prilosec.   Needs to follow up with dentist - broke two teeth during pandemic  HPI Living arrangements - the patient lives with their spouse. Hearing screen: no problems with hearing Vision screen: completed. See below Advance Directives/Living Will: does not have one but would like to get in place.   Review of Systems  Constitutional: Negative for activity change, appetite change, chills, fatigue, fever and unexpected weight change.  HENT: Negative for congestion, ear pain, hearing loss, sinus pressure, sinus pain, sore throat and trouble swallowing.   Eyes: Negative for pain and visual disturbance.  Respiratory: Negative for cough, chest tightness, shortness of breath and wheezing.   Cardiovascular: Negative for chest pain, palpitations and leg swelling.  Gastrointestinal: Negative for abdominal pain, blood in stool, constipation, diarrhea, nausea and vomiting.  Genitourinary: Negative for difficulty urinating and menstrual problem.  Musculoskeletal: Negative for arthralgias and back pain.  Skin: Negative for rash.  Neurological: Negative for dizziness (had vertigo spell since last visit; lasted just one day),  weakness, numbness and headaches.  Hematological: Negative for adenopathy. Does not bruise/bleed easily.  Psychiatric/Behavioral: Negative for sleep disturbance and suicidal ideas. The patient is not nervous/anxious.     Social History   Socioeconomic History  . Marital status: Married    Spouse name: Not on file  . Number of children: Not on file  . Years of education: Not on file  . Highest education level: Not on file  Occupational History  . Not on file  Tobacco Use  . Smoking status: Former Smoker    Quit date: 02/08/1968    Years since quitting: 51.4  . Smokeless tobacco: Never Used  Substance and Sexual Activity  . Alcohol use: No  . Drug use: No  . Sexual activity: Not on file  Other Topics Concern  . Not on file  Social History Narrative  . Not on file   Social Determinants of Health   Financial Resource Strain:   . Difficulty of Paying Living Expenses:   Food Insecurity:   . Worried About Charity fundraiser in the Last Year:   . Arboriculturist in the Last Year:   Transportation Needs:   . Film/video editor (Medical):   Marland Kitchen Lack of Transportation (Non-Medical):   Physical Activity:   . Days of Exercise per Week:   . Minutes of Exercise per Session:   Stress:   . Feeling of Stress :   Social Connections:   . Frequency of Communication with Friends and Family:   . Frequency of Social Gatherings with Friends and Family:   . Attends Religious Services:   . Active Member of Clubs or Organizations:   . Attends Archivist  Meetings:   Marland Kitchen Marital Status:   Intimate Partner Violence:   . Fear of Current or Ex-Partner:   . Emotionally Abused:   Marland Kitchen Physically Abused:   . Sexually Abused:     Patient Active Problem List   Diagnosis Date Noted  . History of colonic polyps 08/02/2011  . Paroxysmal atrial fibrillation ([place]) 04/15/2011    No past surgical history on file.  Her family history includes CAD in her father; Diabetes in her father.      Previous Medications   CALCIUM CARBONATE-VIT D-MIN (CALTRATE 600+D PLUS) 600-400 MG-UNIT PER TABLET    Take 1 tablet by mouth daily.     FLAXSEED, LINSEED, (FLAX SEED OIL) 1000 MG CAPS    Take 1 capsule by mouth.   MULTIPLE VITAMIN (MULTIVITAMIN) TABLET    Take 1 tablet by mouth daily.     OMEPRAZOLE (PRILOSEC) 20 MG CAPSULE    Take 20 mg by mouth daily.   PAROXETINE (PAXIL) 10 MG TABLET    TK 1 T PO QD   PSYLLIUM (REGULOID) 0.52 G CAPSULE    Take 0.52 g by mouth daily.    Patient Care Team: Caren Macadam, MD as PCP - General (Family Medicine)      Objective:   Vitals: BP 140/90 (BP Location: Left Arm, Patient Position: Sitting, Cuff Size: Normal)   Pulse 72   Temp 97.9 F (36.6 C) (Temporal)   Ht 5\' 2"  (1.575 m)   Wt 134 lb 12.8 oz (61.1 kg)   SpO2 97%   BMI 24.66 kg/m   Physical Exam Constitutional:      General: She is not in acute distress.    Appearance: She is well-developed.  HENT:     Head: Normocephalic and atraumatic.     Right Ear: External ear normal.     Left Ear: External ear normal.     Mouth/Throat:     Pharynx: No oropharyngeal exudate.  Eyes:     Conjunctiva/sclera: Conjunctivae normal.     Pupils: Pupils are equal, round, and reactive to light.  Neck:     Thyroid: No thyromegaly.  Cardiovascular:     Rate and Rhythm: Normal rate and regular rhythm.     Heart sounds: Normal heart sounds. No murmur heard.  No friction rub. No gallop.   Pulmonary:     Effort: Pulmonary effort is normal.     Breath sounds: Normal breath sounds.  Abdominal:     General: Bowel sounds are normal. There is no distension.     Palpations: Abdomen is soft. There is no mass.     Tenderness: There is no abdominal tenderness. There is no guarding.     Hernia: No hernia is present.  Musculoskeletal:        General: No tenderness or deformity. Normal range of motion.     Cervical back: Normal range of motion and neck supple.  Lymphadenopathy:     Cervical: No  cervical adenopathy.  Skin:    General: Skin is warm and dry.     Findings: No rash.     Comments: Scattered seborrheic keratosis on back  Neurological:     Mental Status: She is alert and oriented to person, place, and time.     Deep Tendon Reflexes: Reflexes normal.     Reflex Scores:      Tricep reflexes are 2+ on the right side and 2+ on the left side.      Bicep reflexes are 2+ on  the right side and 2+ on the left side.      Brachioradialis reflexes are 2+ on the right side and 2+ on the left side.      Patellar reflexes are 2+ on the right side and 2+ on the left side. Psychiatric:        Speech: Speech normal.        Behavior: Behavior normal.        Thought Content: Thought content normal.       Hearing Screening   125Hz  250Hz  500Hz  1000Hz  2000Hz  3000Hz  4000Hz  6000Hz  8000Hz   Right ear:           Left ear:             Visual Acuity Screening   Right eye Left eye Both eyes  Without correction: 20/50 20/30 20/25   With correction:       Activities of Daily Living No flowsheet data found.  Fall Risk Assessment Fall Risk  07/26/2019 01/30/2018 01/06/2016 09/30/2014  Falls in the past year? 0 1 No No  Number falls in past yr: 0 0 - -  Injury with Fall? 0 1 - -     Patient reports there are safety devices in place in shower at home. They have shower chair - planning on getting another one.   Depression Screen PHQ 2/9 Scores 07/26/2019 01/30/2018 01/06/2016 09/30/2014  PHQ - 2 Score 0 1 0 0  PHQ- 9 Score 0 - - -    Cognitive Testing - 6-CIT   Correct? Score   What year is it? yes 0 Yes = 0    No = 4  What month is it? yes 0 Yes = 0    No = 3  Remember:     [address]ia [address], [place], [place]     What time is it? yes 0 Yes = 0    No = 3  Count backwards from 20 to 1 yes 0 Correct = 0    1 error = 2   More than 1 error = 4  Say the months of the year in reverse. yes 0 Correct = 0    1 error = 2   More than 1 error = 4  What address did I ask you to remember? yes 0  Correct = 0  1 error = 2    2 error = 4    3 error = 6    4 error = 8    All wrong = 10       TOTAL SCORE  0/28   Interpretation:  Normal  Normal (0-7) Abnormal (8-28)    Health Maintenance  Topic Date Due  . DEXA SCAN  Never done  . MAMMOGRAM  11/11/2017  . TETANUS/TDAP  09/29/2024 (Originally 02/07/2006)  . INFLUENZA VACCINE  09/08/2019  . PNA vac Low Risk Adult (2 of 2 - PPSV23) 11/01/2019  . COVID-19 Vaccine  Completed    Assessment & Plan:     Initial Preventative Physical Exam  Reviewed patient's Family Medical History Reviewed and updated list of patient's medical providers Assessment of cognitive impairment was done Assessed patient's functional ability Established a written schedule for health screening [place] Completed and Reviewed  Exercise Activities and Dietary recommendations Goals   None     Immunization History  Administered Date(s) Administered  . Fluad Quad(high Dose 65+) 11/01/2018  . Influenza, High Dose Seasonal PF 11/24/2016, 11/09/2017  . Influenza-Unspecified 01/22/2015, 01/08/2016  .  PFIZER SARS-COV-2 Vaccination 02/20/2019, 03/12/2019  . Pneumococcal Conjugate-13 11/01/2018  . Td 02/08/1996    Health Maintenance  Topic Date Due  . DEXA SCAN  Never done  . MAMMOGRAM  11/11/2017  . TETANUS/TDAP  09/29/2024 (Originally 02/07/2006)  . INFLUENZA VACCINE  09/08/2019  . PNA vac Low Risk Adult (2 of 2 - PPSV23) 11/01/2019  . COVID-19 Vaccine  Completed     Discussed health benefits of physical activity, and encouraged her to engage in regular exercise appropriate for her age and condition.    ------------------------------------------------------------------------------------------------------------  1. Encounter for annual wellness exam in Medicare patient Advance directives/living will packet given Suggest shingrix and tdap through pharmacy Keep up with daily exercise  2. Estrogen deficiency Patient will  schedule dexa - DG Bone Density; Future  3. Encounter for screening mammogram for malignant neoplasm of breast Patient will schedule mammogram - MM DIGITAL SCREENING BILATERAL; Future  4. Hyponatremia - Comprehensive metabolic panel; Future  5. Lipid screening - Lipid panel; Future  6. Screening for diabetes mellitus - Hemoglobin A1c; Future  7. Easy bruising - CBC with Differential/Platelet; Future   Return in about 6 months (around 01/25/2020).

## 2019-07-26 NOTE — Patient Instructions (Signed)
Consider shingrix at the pharmacy  Consider Tdap booster

## 2019-08-07 ENCOUNTER — Encounter: Payer: Self-pay | Admitting: Family Medicine

## 2019-08-23 DIAGNOSIS — M810 Age-related osteoporosis without current pathological fracture: Secondary | ICD-10-CM | POA: Diagnosis not present

## 2019-08-23 DIAGNOSIS — M8589 Other specified disorders of bone density and structure, multiple sites: Secondary | ICD-10-CM | POA: Diagnosis not present

## 2019-08-23 DIAGNOSIS — Z1231 Encounter for screening mammogram for malignant neoplasm of breast: Secondary | ICD-10-CM | POA: Diagnosis not present

## 2019-08-23 DIAGNOSIS — Z780 Asymptomatic menopausal state: Secondary | ICD-10-CM | POA: Diagnosis not present

## 2019-08-23 LAB — HM DEXA SCAN

## 2019-08-23 LAB — HM MAMMOGRAPHY

## 2019-08-27 ENCOUNTER — Encounter: Payer: Self-pay | Admitting: Family Medicine

## 2019-09-09 ENCOUNTER — Ambulatory Visit (INDEPENDENT_AMBULATORY_CARE_PROVIDER_SITE_OTHER): Payer: Medicare Other | Admitting: Family Medicine

## 2019-09-09 ENCOUNTER — Other Ambulatory Visit: Payer: Self-pay

## 2019-09-09 ENCOUNTER — Encounter: Payer: Self-pay | Admitting: Family Medicine

## 2019-09-09 VITALS — BP 138/90 | HR 75 | Temp 98.5°F | Wt 131.0 lb

## 2019-09-09 DIAGNOSIS — S61452A Open bite of left hand, initial encounter: Secondary | ICD-10-CM

## 2019-09-09 DIAGNOSIS — W5501XA Bitten by cat, initial encounter: Secondary | ICD-10-CM | POA: Diagnosis not present

## 2019-09-09 DIAGNOSIS — L03114 Cellulitis of left upper limb: Secondary | ICD-10-CM | POA: Diagnosis not present

## 2019-09-09 MED ORDER — METRONIDAZOLE 500 MG PO TABS: 500.0000 mg | ORAL_TABLET | Freq: Three times a day (TID) | ORAL | 0 refills | Status: AC

## 2019-09-09 MED ORDER — DOXYCYCLINE HYCLATE 100 MG PO TABS: 100.0000 mg | ORAL_TABLET | Freq: Two times a day (BID) | ORAL | 0 refills | Status: AC

## 2019-09-09 NOTE — Patient Instructions (Signed)
Animal Bite, Adult Animal bites range from mild to serious. An animal bite can result in any of these injuries:  A scratch.  A deep, open cut.  A puncture of the skin.  A crush injury.  Tearing away of the skin or a body part.  A bone injury. A small bite from a house pet is usually less serious than a bite from a stray or wild animal, such as a raccoon, fox, skunk, or bat. That is because stray and wild animals have a higher risk of carrying a serious infection called rabies, which can be passed to humans through a bite. What increases the risk? You are more likely to be bitten by an animal if:  You are around unfamiliar pets.  You disturb an animal when it is eating, sleeping, or caring for its babies.  You are outdoors in a place where small, wild animals roam freely. What are the signs or symptoms? Common symptoms of an animal bite include:  Pain.  Bleeding.  Swelling.  Bruising. How is this diagnosed? This condition may be diagnosed based on a physical exam and medical history. Your health care provider will examine your wound and ask for details about the animal and how the bite happened. You may also have tests, such as:  Blood tests to check for infection.  X-rays to check for damage to bones or joints.  Taking a fluid sample from your wound and checking it for infection (culture test). How is this treated? Treatment varies depending on the type of animal, where the bite is on your body, and your medical history. Treatment may include:  Caring for the wound. This often includes cleaning the wound, rinsing out (flushing) the wound with saline solution, and applying a bandage (dressing). In some cases, the wound may be closed with stitches (sutures), staples, skin glue, or adhesive strips.  Antibiotic medicine to prevent or treat infection. This medicine may be prescribed in pill or ointment form. If the bite area becomes infected, the medicine may be given through  an IV.  A tetanus shot to prevent tetanus infection.  Rabies treatment to prevent rabies infection. This will be done if the animal could have rabies.  Surgery. This may be done if a bite gets infected or if there is damage that needs to be repaired. Follow these instructions at home: Wound care   Follow instructions from your health care provider about how to take care of your wound. Make sure you: ? Wash your hands with soap and water before you change your dressing. If soap and water are not available, use hand sanitizer. ? Change your dressing as told by your health care provider. ? Leave sutures, skin glue, or adhesive strips in place. These skin closures may need to stay in place for 2 weeks or longer. If adhesive strip edges start to loosen and curl up, you may trim the loose edges. Do not remove adhesive strips completely unless your health care provider tells you to do that.  Check your wound every day for signs of infection. Check for: ? More redness, swelling, or pain. ? More fluid or blood. ? Warmth. ? Pus or a bad smell. Medicines  Take or apply over-the-counter and prescription medicines only as told by your health care provider.  If you were prescribed an antibiotic, take or apply it as told by your health care provider. Do not stop using the antibiotic even if your condition improves. General instructions   Keep the injured   area raised (elevated) above the level of your heart while you are sitting or lying down, if this is possible.  If directed, put ice on the injured area. ? Put ice in a plastic bag. ? Place a towel between your skin and the bag. ? Leave the ice on for 20 minutes, 2-3 times per day.  Keep all follow-up visits as told by your health care provider. This is important. Contact a health care provider if:  You have more redness, swelling, or pain around your wound.  Your wound feels warm to the touch.  You have a fever or chills.  You have a  general feeling of sickness (malaise).  You feel nauseous or you vomit.  You have pain that does not get better. Get help right away if:  You have a red streak that leads away from your wound.  You have non-clear fluid or more blood coming from your wound.  There is pus or a bad smell coming from your wound.  You have trouble moving your injured area.  You have numbness or tingling that extends beyond the wound. Summary  Animal bites can range from mild to serious. An animal bite can cause a scratch on the skin, a deep open cut, a puncture of the skin, a crush injury, tearing away of the skin or a body part, or a bone injury.  Your health care provider will examine your wound and ask for details about the animal and how the bite happened.  You may also have tests such as a blood test, X-ray, or testing of a fluid sample from your wound (culture test).  Treatment may include wound care, antibiotic medicine, a tetanus shot, and rabies treatment if the animal could have rabies. This information is not intended to replace advice given to you by your health care provider. Make sure you discuss any questions you have with your health care provider. Document Revised: 01/19/2017 Document Reviewed: 08/04/2016 Elsevier Patient Education  [place].  Cellulitis, Adult  Cellulitis is a skin infection. The infected area is often warm, red, swollen, and sore. It occurs most often in the arms and lower legs. It is very important to get treated for this condition. What are the causes? This condition is caused by bacteria. The bacteria enter through a break in the skin, such as a cut, burn, insect bite, open sore, or crack. What increases the risk? This condition is more likely to occur in people who:  Have a weak body defense system (immune system).  Have open cuts, burns, bites, or scrapes on the skin.  Are older than 81 years of age.  Have a blood sugar problem  (diabetes).  Have a long-lasting (chronic) liver disease (cirrhosis) or kidney disease.  Are very overweight (obese).  Have a skin problem, such as: ? Itchy rash (eczema). ? Slow movement of blood in the veins (venous stasis). ? Fluid buildup below the skin (edema).  Have been treated with high-energy rays (radiation).  Use IV drugs. What are the signs or symptoms? Symptoms of this condition include:  Skin that is: ? Red. ? Streaking. ? Spotting. ? Swollen. ? Sore or painful when you touch it. ? Warm.  A fever.  Chills.  Blisters. How is this diagnosed? This condition is diagnosed based on:  Medical history.  Physical exam.  Blood tests.  Imaging tests. How is this treated? Treatment for this condition may include:  Medicines to treat infections or allergies.  Home care, such  as: ? Rest. ? Placing cold or warm cloths (compresses) on the skin.  Hospital care, if the condition is very bad. Follow these instructions at home: Medicines  Take over-the-counter and prescription medicines only as told by your doctor.  If you were prescribed an antibiotic medicine, take it as told by your doctor. Do not stop taking it even if you start to feel better. General instructions   Drink enough fluid to keep your pee (urine) pale yellow.  Do not touch or rub the infected area.  Raise (elevate) the infected area above the level of your heart while you are sitting or lying down.  Place cold or warm cloths on the area as told by your doctor.  Keep all follow-up visits as told by your doctor. This is important. Contact a doctor if:  You have a fever.  You do not start to get better after 1-2 days of treatment.  Your bone or joint under the infected area starts to hurt after the skin has healed.  Your infection comes back. This can happen in the same area or another area.  You have a swollen bump in the area.  You have new symptoms.  You feel ill and have  muscle aches and pains. Get help right away if:  Your symptoms get worse.  You feel very sleepy.  You throw up (vomit) or have watery poop (diarrhea) for a long time.  You see red streaks coming from the area.  Your red area gets larger.  Your red area turns dark in color. These symptoms may represent a serious problem that is an emergency. Do not wait to see if the symptoms will go away. Get medical help right away. Call your local emergency services (911 in the [place] Do not drive yourself to the hospital. Summary  Cellulitis is a skin infection. The area is often warm, red, swollen, and sore.  This condition is treated with medicines, rest, and cold and warm cloths.  Take all medicines only as told by your doctor.  Tell your doctor if symptoms do not start to get better after 1-2 days of treatment. This information is not intended to replace advice given to you by your health care provider. Make sure you discuss any questions you have with your health care provider. Document Revised: 06/15/2017 Document Reviewed: 06/15/2017 Elsevier Patient Education  [place].

## 2019-09-09 NOTE — Progress Notes (Signed)
Subjective:    Patient ID: Sydney Lucas, female    DOB: 07/10/1940, 79 y.o.   MRN: [id number]  No chief complaint on file.   HPI Patient was seen today for acute concern. Patient endorses cat bite of left hand. Pt picked up her cat yesterday during a thunderstorm. Pt did not realize she was bitten a lot she had a scratch so she cleaned the wound and applied with bandage to the right proximal. Pt with left hand edema, erythema this morning. Able to make a fist without difficulty. Denies fever, chills, nausea, vomiting. Last tetanus shot 2016.  Past Medical History:  Diagnosis Date  . Headache(784.0)     Allergies  Allergen Reactions  . Imipramine Nausea Only and Anxiety    Blurred vision, increased heart rate,   . Penicillins     REACTION: rash  . Tramadol Anxiety    ROS General: Denies fever, chills, night sweats, changes in weight, changes in appetite HEENT: Denies headaches, ear pain, changes in vision, rhinorrhea, sore throat CV: Denies CP, palpitations, SOB, orthopnea Pulm: Denies SOB, cough, wheezing GI: Denies abdominal pain, nausea, vomiting, diarrhea, constipation GU: Denies dysuria, hematuria, frequency, vaginal discharge Msk: Denies muscle cramps, joint pains Neuro: Denies weakness, numbness, tingling Skin: Denies rashes, bruising  +cat bite with edema and erythema. Psych: Denies depression, anxiety, hallucinations   Objective:    Blood pressure 138/90, pulse 75, temperature 98.5 F (36.9 C), temperature source Oral, weight 131 lb (59.4 kg), SpO2 96 %.  Gen. Pleasant, well-nourished, in no distress, normal affect   HEENT: [place]/AT, face symmetric, conjunctiva clear, no scleral icterus, PERRLA, EOMI, nares patent without drainage Lungs: no accessory muscle use. Cardiovascular: RRR, no peripheral edema.  Good cap refill bilateral hands. Musculoskeletal: FROM of L hand.  No deformities, no cyanosis or clubbing, normal tone Neuro:  A&Ox3, CN II-XII intact,  normal gait Skin:  Warm.  Dorsum of left hand with erythema, increased, edema.  2 puncture wounds noted.  Right hand with healing abrasion without drainage, increased warmth, or induration.      Wt Readings from Last 3 Encounters:  07/26/19 134 lb 12.8 oz (61.1 kg)  11/06/18 136 lb (61.7 kg)  03/27/18 121 lb (54.9 kg)    Lab Results  Component Value Date   WBC 7.6 07/26/2019   HGB 12.3 07/26/2019   HCT 36.7 07/26/2019   PLT 345.0 07/26/2019   GLUCOSE 102 (H) 07/26/2019   CHOL 198 07/26/2019   TRIG 159.0 (H) 07/26/2019   HDL 47.30 07/26/2019   LDLDIRECT 110.7 08/16/2012   LDLCALC 119 (H) 07/26/2019   ALT 15 07/26/2019   AST 20 07/26/2019   NA 131 (L) 07/26/2019   K 4.3 07/26/2019   CL 98 07/26/2019   CREATININE 0.73 07/26/2019   BUN 10 07/26/2019   CO2 27 07/26/2019   TSH 1.41 01/01/2018   HGBA1C 6.2 07/26/2019    Assessment/Plan:  Cat bite of left hand, initial encounter  -Given allergy to penicillin (caused rash) will avoid Augmentin. -Tetanus vaccine is up-to-date.  Done in 2016 -Will need to cover against staph, strep, anaerobes -Given handout -Given strict precautions.  Monitor for development of abscess. - Plan: doxycycline (VIBRA-TABS) 100 MG tablet, metroNIDAZOLE (FLAGYL) 500 MG tablet  Cellulitis of left upper extremity  -We will start antibiotics. -Given strict precautions - Plan: doxycycline (VIBRA-TABS) 100 MG tablet, metroNIDAZOLE (FLAGYL) 500 MG tablet  F/u as needed for continued or worsening symptoms in the next few days  Grier Mitts, MD

## 2019-09-10 ENCOUNTER — Encounter: Payer: Self-pay | Admitting: Family Medicine

## 2019-09-10 ENCOUNTER — Other Ambulatory Visit: Payer: Self-pay | Admitting: *Deleted

## 2019-09-10 MED ORDER — ALENDRONATE SODIUM 70 MG PO TABS
70.0000 mg | ORAL_TABLET | ORAL | 11 refills | Status: DC
Start: 2019-09-10 — End: 2020-03-25

## 2019-11-30 ENCOUNTER — Encounter: Payer: Self-pay | Admitting: Family Medicine

## 2019-12-03 ENCOUNTER — Other Ambulatory Visit: Payer: Self-pay | Admitting: Family Medicine

## 2019-12-03 MED ORDER — PAROXETINE HCL 10 MG PO TABS: ORAL_TABLET | ORAL | 1 refills | Status: DC

## 2020-03-24 ENCOUNTER — Other Ambulatory Visit: Payer: Self-pay

## 2020-03-25 ENCOUNTER — Encounter: Payer: Self-pay | Admitting: Family Medicine

## 2020-03-25 ENCOUNTER — Ambulatory Visit (INDEPENDENT_AMBULATORY_CARE_PROVIDER_SITE_OTHER): Payer: Medicare Other | Admitting: Family Medicine

## 2020-03-25 VITALS — BP 130/80 | HR 69 | Temp 98.4°F | Ht 62.0 in | Wt 116.8 lb

## 2020-03-25 DIAGNOSIS — M810 Age-related osteoporosis without current pathological fracture: Secondary | ICD-10-CM | POA: Diagnosis not present

## 2020-03-25 DIAGNOSIS — Z13220 Encounter for screening for lipoid disorders: Secondary | ICD-10-CM

## 2020-03-25 DIAGNOSIS — F419 Anxiety disorder, unspecified: Secondary | ICD-10-CM | POA: Diagnosis not present

## 2020-03-25 DIAGNOSIS — R7301 Impaired fasting glucose: Secondary | ICD-10-CM

## 2020-03-25 DIAGNOSIS — E871 Hypo-osmolality and hyponatremia: Secondary | ICD-10-CM

## 2020-03-25 LAB — COMPREHENSIVE METABOLIC PANEL
ALT: 16 U/L (ref 0–35)
AST: 20 U/L (ref 0–37)
Albumin: 4.2 g/dL (ref 3.5–5.2)
Alkaline Phosphatase: 50 U/L (ref 39–117)
BUN: 13 mg/dL (ref 6–23)
CO2: 28 mEq/L (ref 19–32)
Calcium: 9.5 mg/dL (ref 8.4–10.5)
Chloride: 101 mEq/L (ref 96–112)
Creatinine, Ser: 0.71 mg/dL (ref 0.40–1.20)
GFR: 79.83 mL/min (ref >60.00)
Glucose, Bld: 84 mg/dL (ref 70–99)
Potassium: 4.3 mEq/L (ref 3.5–5.1)
Sodium: 136 mEq/L (ref 135–145)
Total Bilirubin: 1 mg/dL (ref 0.2–1.2)
Total Protein: 7.1 g/dL (ref 6.0–8.3)

## 2020-03-25 LAB — HEMOGLOBIN A1C: Hgb A1c MFr Bld: 6 % (ref 4.6–6.5)

## 2020-03-25 LAB — VITAMIN D 25 HYDROXY (VIT D DEFICIENCY, FRACTURES): VITD: 48.14 ng/mL (ref 30.00–100.00)

## 2020-03-25 MED ORDER — ALENDRONATE SODIUM 70 MG PO TABS: 70.0000 mg | ORAL_TABLET | ORAL | 3 refills | Status: DC

## 2020-03-25 MED ORDER — PAROXETINE HCL 10 MG PO TABS: ORAL_TABLET | ORAL | 1 refills | Status: DC

## 2020-03-25 NOTE — Progress Notes (Signed)
Sydney Lucas DOB: 10-20-1940 Encounter date: 03/25/2020  This is a 80 y.o. female who presents with Chief Complaint  Patient presents with  . Follow-up    History of present illness: Osteoporosis: fosamax, ca/vitamin D.  No side effects of medication.  No heartburn. Anxiety: paxil 10mg  daily.  She is no longer following with psychiatry.  She is worried about the elevated sugar that she had last year.  Energy level is ok. She stays busy. Mood is doing well.    Allergies  Allergen Reactions  . Imipramine Nausea Only and Anxiety    Blurred vision, increased heart rate,   . Penicillins     REACTION: rash  . Tramadol Anxiety   Current Meds  Medication Sig  . alendronate (FOSAMAX) 70 MG tablet Take 1 tablet (70 mg total) by mouth every 7 (seven) days. Take with a full glass of water on an empty stomach.  . Calcium Carbonate-Vit D-Min (CALTRATE 600+D PLUS) 600-400 MG-UNIT per tablet Take 1 tablet by mouth daily.  . Multiple Vitamin (MULTIVITAMIN) tablet Take 1 tablet by mouth daily.  Marland Kitchen omeprazole (PRILOSEC) 20 MG capsule Take 20 mg by mouth daily.  Marland Kitchen PARoxetine (PAXIL) 10 MG tablet Take one tablet PO daily  . psyllium (REGULOID) 0.52 g capsule Take 0.52 g by mouth daily.    Review of Systems  Constitutional: Negative for chills, fatigue and fever.  Respiratory: Negative for cough, chest tightness, shortness of breath and wheezing.   Cardiovascular: Negative for chest pain, palpitations and leg swelling.    Objective:  BP 130/80 (BP Location: Left Arm, Patient Position: Sitting, Cuff Size: Normal)   Pulse 69   Temp 98.4 F (36.9 C) (Oral)   Ht 5\' 2"  (1.575 m)   Wt 116 lb 12.8 oz (53 kg)   SpO2 98%   BMI 21.36 kg/m   Weight: 116 lb 12.8 oz (53 kg)   BP Readings from Last 3 Encounters:  03/25/20 130/80  09/09/19 138/90  07/26/19 140/90   Wt Readings from Last 3 Encounters:  03/25/20 116 lb 12.8 oz (53 kg)  09/09/19 131 lb (59.4 kg)  07/26/19 134 lb  12.8 oz (61.1 kg)    Physical Exam Constitutional:      General: She is not in acute distress.    Appearance: She is well-developed.  Cardiovascular:     Rate and Rhythm: Normal rate and regular rhythm.     Heart sounds: Normal heart sounds. No murmur heard. No friction rub.  Pulmonary:     Effort: Pulmonary effort is normal. No respiratory distress.     Breath sounds: Normal breath sounds. No wheezing or rales.  Musculoskeletal:     Right lower leg: No edema.     Left lower leg: No edema.  Neurological:     Mental Status: She is alert and oriented to person, place, and time.  Psychiatric:        Behavior: Behavior normal.     Assessment/Plan  1. Age related osteoporosis, unspecified pathological fracture presence She is tolerating the Fosamax well.  We will check vitamin D levels to make sure these are normal.  She does get regular exercise as well as calcium intake (supplemental) - VITAMIN D 25 Hydroxy (Vit-D Deficiency, Fractures); Future - VITAMIN D 25 Hydroxy (Vit-D Deficiency, Fractures)  2. Anxiety She has done very well on the Paxil.  Mood is stable.  Tolerating medication well.  3. Impaired fasting glucose - Hemoglobin A1c; Future - Hemoglobin A1c  4. Lipid  screening  5. Hyponatremia - Comprehensive metabolic panel; Future - Comprehensive metabolic panel    Return in about 6 months (around 09/22/2020) for physical exam.    Micheline Rough, MD

## 2020-05-28 ENCOUNTER — Other Ambulatory Visit: Payer: Self-pay | Admitting: *Deleted

## 2020-05-28 MED ORDER — ALENDRONATE SODIUM 70 MG PO TABS: 70.0000 mg | ORAL_TABLET | ORAL | 1 refills | Status: DC

## 2020-05-28 NOTE — Telephone Encounter (Signed)
Rx done. 

## 2020-07-02 ENCOUNTER — Other Ambulatory Visit: Payer: Self-pay | Admitting: *Deleted

## 2020-07-02 MED ORDER — PAROXETINE HCL 10 MG PO TABS: ORAL_TABLET | ORAL | 0 refills | Status: DC

## 2020-07-02 NOTE — Telephone Encounter (Signed)
Rx done. 

## 2020-07-17 ENCOUNTER — Telehealth: Payer: Self-pay | Admitting: Family Medicine

## 2020-07-17 NOTE — Telephone Encounter (Signed)
Left message for patient to call back and schedule Medicare Annual Wellness Visit (AWV) either virtually or in office.   Last AWV 07/26/19 please schedule at anytime with [facility] Nurse Health Advisor 1 or 2   This should be a 45 minute visit.

## 2020-07-28 ENCOUNTER — Ambulatory Visit (INDEPENDENT_AMBULATORY_CARE_PROVIDER_SITE_OTHER): Payer: Medicare Other

## 2020-07-28 ENCOUNTER — Encounter: Payer: Self-pay | Admitting: Family Medicine

## 2020-07-28 DIAGNOSIS — Z0000 Encounter for general adult medical examination without abnormal findings: Secondary | ICD-10-CM

## 2020-07-28 NOTE — Progress Notes (Signed)
Subjective:   Sydney Lucas is a 80 y.o. female who presents for Medicare Annual (Subsequent) preventive examination.  I connected with Sydney Lucas today by telephone and verified that I am speaking with the correct person using two identifiers. Location patient: home Location provider: work Persons participating in the virtual visit: patient, provider.   I discussed the limitations, risks, security and privacy concerns of performing an evaluation and management service by telephone and the availability of in person appointments. I also discussed with the patient that there may be a patient responsible charge related to this service. The patient expressed understanding and verbally consented to this telephonic visit.    Interactive audio and video telecommunications were attempted between this provider and patient, however failed, due to patient having technical difficulties OR patient did not have access to video capability.  We continued and completed visit with audio only.     Review of Systems    N/a       Objective:    There were no vitals filed for this visit. There is no height or weight on file to calculate BMI.  Advanced Directives 09/01/2016  Does Patient Have a Medical Advance Directive? Yes  Does patient want to make changes to medical advance directive? No - Patient declined    Current Medications (verified) Outpatient Encounter Medications as of 07/28/2020  Medication Sig   alendronate (FOSAMAX) 70 MG tablet Take 1 tablet (70 mg total) by mouth every 7 (seven) days. Take with a full glass of water on an empty stomach.   Calcium Carbonate-Vit D-Min (CALTRATE 600+D PLUS) 600-400 MG-UNIT per tablet Take 1 tablet by mouth daily.   Multiple Vitamin (MULTIVITAMIN) tablet Take 1 tablet by mouth daily.   omeprazole (PRILOSEC) 20 MG capsule Take 20 mg by mouth daily.   PARoxetine (PAXIL) 10 MG tablet Take one tablet PO daily   psyllium (REGULOID)  0.52 g capsule Take 0.52 g by mouth daily.   No facility-administered encounter medications on file as of 07/28/2020.    Allergies (verified) Imipramine, Penicillins, and Tramadol   History: Past Medical History:  Diagnosis Date   Headache(784.0)    No past surgical history on file. Family History  Problem Relation Age of Onset   Diabetes Father    CAD Father    Social History   Socioeconomic History   Marital status: Married    Spouse name: Not on file   Number of children: Not on file   Years of education: Not on file   Highest education level: Not on file  Occupational History   Not on file  Tobacco Use   Smoking status: Former    Pack years: 0.00    Types: Cigarettes    Quit date: 02/08/1968    Years since quitting: 52.5   Smokeless tobacco: Never  Substance and Sexual Activity   Alcohol use: No   Drug use: No   Sexual activity: Not on file  Other Topics Concern   Not on file  Social History Narrative   Not on file   Social Determinants of Health   Financial Resource Strain: Not on file  Food Insecurity: Not on file  Transportation Needs: Not on file  Physical Activity: Not on file  Stress: Not on file  Social Connections: Not on file    Tobacco Counseling Counseling given: Not Answered   Clinical Intake:                 Diabetic?no  Activities of Daily Living No flowsheet data found.  Patient Care Team: Caren Macadam, MD as PCP - General (Family Medicine)  Indicate any recent Medical Services you may have received from other than [facility] providers in the past year (date may be approximate).     Assessment:   This is a routine wellness examination for Sydney Lucas.  Hearing/Vision screen No results found.  Dietary issues and exercise activities discussed:     Goals Addressed   None    Depression Screen PHQ 2/9 Scores 07/26/2019 01/30/2018 01/06/2016 09/30/2014  PHQ - 2 Score 0 1 0 0  PHQ- 9 Score 0 - - -     Fall Risk Fall Risk  07/26/2019 01/30/2018 01/06/2016 09/30/2014  Falls in the past year? 0 1 No No  Number falls in past yr: 0 0 - -  Injury with Fall? 0 1 - -    FALL RISK PREVENTION PERTAINING TO THE HOME:  Any stairs in or around the home? Yes  If so, are there any without handrails? No  Home free of loose throw rugs in walkways, pet beds, electrical cords, etc? Yes  Adequate lighting in your home to reduce risk of falls? Yes   ASSISTIVE DEVICES UTILIZED TO PREVENT FALLS:  Life alert? No  Use of a cane, walker or w/c? No  Grab bars in the bathroom? Yes  Shower chair or bench in shower? Yes  Elevated toilet seat or a handicapped toilet? Yes    Cognitive Function:    Normal cognitive status assessed by direct observation by this Nurse Health Advisor. No abnormalities found.      Immunizations Immunization History  Administered Date(s) Administered   Fluad Quad(high Dose 65+) 11/01/2018   Influenza, High Dose Seasonal PF 11/24/2016, 11/09/2017   Influenza-Unspecified 01/22/2015, 01/08/2016, 11/08/2019   PFIZER(Purple Top)SARS-COV-2 Vaccination 02/20/2019, 03/12/2019, 10/09/2019   Pneumococcal Conjugate-13 11/01/2018   Td 02/08/1996    TDAP status: Due, Education has been provided regarding the importance of this vaccine. Advised may receive this vaccine at local pharmacy or Health Dept. Aware to provide a copy of the vaccination record if obtained from local pharmacy or Health Dept. Verbalized acceptance and understanding.  Flu Vaccine status: Up to date  Pneumococcal vaccine status: Due, Education has been provided regarding the importance of this vaccine. Advised may receive this vaccine at local pharmacy or Health Dept. Aware to provide a copy of the vaccination record if obtained from local pharmacy or Health Dept. Verbalized acceptance and understanding.  Covid-19 vaccine status: Completed vaccines  Qualifies for Shingles Vaccine? Yes   Zostavax completed No    Shingrix Completed?: No.    Education has been provided regarding the importance of this vaccine. Patient has been advised to call insurance company to determine out of pocket expense if they have not yet received this vaccine. Advised may also receive vaccine at local pharmacy or Health Dept. Verbalized acceptance and understanding.  Screening Tests Health Maintenance  Topic Date Due   Zoster Vaccines- Shingrix (1 of 2) Never done   PNA vac Low Risk Adult (2 of 2 - PPSV23) 11/01/2019   COVID-19 Vaccine (4 - Booster for Pfizer series) 02/08/2020   TETANUS/TDAP  09/29/2024 (Originally 02/07/2006)   MAMMOGRAM  08/22/2020   INFLUENZA VACCINE  09/07/2020   DEXA SCAN  Completed   HPV VACCINES  Aged Out    Health Maintenance  Health Maintenance Due  Topic Date Due   Zoster Vaccines- Shingrix (1 of 2) Never done  PNA vac Low Risk Adult (2 of 2 - PPSV23) 11/01/2019   COVID-19 Vaccine (4 - Booster for Pfizer series) 02/08/2020    Colorectal cancer screening: No longer required.   Mammogram status: No longer required due to age. Donzetta Kohut Density status: Completed 08/23/2019. Results reflect: Bone density results: OSTEOPOROSIS. Repeat every 2 years.  Lung Cancer Screening: (Low Dose CT Chest recommended if Age 88-80 years, 30 pack-year currently smoking OR have quit w/in 15years.) does not qualify.   Lung Cancer Screening Referral: n/a  Additional Screening:  Hepatitis C Screening: does not qualify  Vision Screening: Recommended annual ophthalmology exams for early detection of glaucoma and other disorders of the eye. Is the patient up to date with their annual eye exam?  Yes  Who is the provider or what is the name of the office in which the patient attends annual eye exams? [facility] If pt is not established with a provider, would they like to be referred to a provider to establish care? No .   Dental Screening: Recommended annual dental exams for proper oral hygiene  Community  Resource Referral / Chronic Care Management: CRR required this visit?  No   CCM required this visit?  No      Plan:     I have personally reviewed and noted the following in the patient's chart:   Medical and social history Use of alcohol, tobacco or illicit drugs  Current medications and supplements including opioid prescriptions.  Functional ability and status Nutritional status Physical activity Advanced directives List of other physicians Hospitalizations, surgeries, and ER visits in previous 12 months Vitals Screenings to include cognitive, depression, and falls Referrals and appointments  In addition, I have reviewed and discussed with patient certain preventive protocols, quality metrics, and best practice recommendations. A written personalized care plan for preventive services as well as general preventive health recommendations were provided to patient.     Randel Pigg, LPN   5/32/9924   Nurse Notes: none

## 2020-07-28 NOTE — Patient Instructions (Signed)
Sydney Lucas , Thank you for taking time to come for your Medicare Wellness Visit. I appreciate your ongoing commitment to your health goals. Please review the following plan we discussed and let me know if I can assist you in the future.   Screening recommendations/referrals: Colonoscopy: no longer required  Mammogram: no longer required Bone Density: 08/23/2019 Recommended yearly ophthalmology/optometry visit for glaucoma screening and checkup Recommended yearly dental visit for hygiene and checkup  Vaccinations: Influenza vaccine: due fall 2022 Pneumococcal vaccine: Due second vaccine  Tdap vaccine: due upon injury  Shingles vaccine: pt will obtain local  pharmacy    Advanced directives: will provide copies   Conditions/risks identified: e   Next appointment: none    Preventive Care 50 Years and Older, Female Preventive care refers to lifestyle choices and visits with your health care provider that can promote health and wellness. What does preventive care include? A yearly physical exam. This is also called an annual well check. Dental exams once or twice a year. Routine eye exams. Ask your health care provider how often you should have your eyes checked. Personal lifestyle choices, including: Daily care of your teeth and gums. Regular physical activity. Eating a healthy diet. Avoiding tobacco and drug use. Limiting alcohol use. Practicing safe sex. Taking low-dose aspirin every day. Taking vitamin and mineral supplements as recommended by your health care provider. What happens during an annual well check? The services and screenings done by your health care provider during your annual well check will depend on your age, overall health, lifestyle risk factors, and family history of disease. Counseling  Your health care provider may ask you questions about your: Alcohol use. Tobacco use. Drug use. Emotional well-being. Home and relationship  well-being. Sexual activity. Eating habits. History of falls. Memory and ability to understand (cognition). Work and work Statistician. Reproductive health. Screening  You may have the following tests or measurements: Height, weight, and BMI. Blood pressure. Lipid and cholesterol levels. These may be checked every 5 years, or more frequently if you are over 41 years old. Skin check. Lung cancer screening. You may have this screening every year starting at age 63 if you have a 30-pack-year history of smoking and currently smoke or have quit within the past 15 years. Fecal occult blood test (FOBT) of the stool. You may have this test every year starting at age 6. Flexible sigmoidoscopy or colonoscopy. You may have a sigmoidoscopy every 5 years or a colonoscopy every 10 years starting at age 22. Hepatitis C blood test. Hepatitis B blood test. Sexually transmitted disease (STD) testing. Diabetes screening. This is done by checking your blood sugar (glucose) after you have not eaten for a while (fasting). You may have this done every 1-3 years. Bone density scan. This is done to screen for osteoporosis. You may have this done starting at age 73. Mammogram. This may be done every 1-2 years. Talk to your health care provider about how often you should have regular mammograms. Talk with your health care provider about your test results, treatment options, and if necessary, the need for more tests. Vaccines  Your health care provider may recommend certain vaccines, such as: Influenza vaccine. This is recommended every year. Tetanus, diphtheria, and acellular pertussis (Tdap, Td) vaccine. You may need a Td booster every 10 years. Zoster vaccine. You may need this after age 32. Pneumococcal 13-valent conjugate (PCV13) vaccine. One dose is recommended after age 26. Pneumococcal polysaccharide (PPSV23) vaccine. One dose is recommended after age 77.  Talk to your health care provider about which  screenings and vaccines you need and how often you need them. This information is not intended to replace advice given to you by your health care provider. Make sure you discuss any questions you have with your health care provider. Document Released: 02/20/2015 Document Revised: 10/14/2015 Document Reviewed: 11/25/2014 Elsevier Interactive Patient Education  2017 [place] Prevention in the Home Falls can cause injuries. They can happen to people of all ages. There are many things you can do to make your home safe and to help prevent falls. What can I do on the outside of my home? Regularly fix the edges of walkways and driveways and fix any cracks. Remove anything that might make you trip as you walk through a door, such as a raised step or threshold. Trim any bushes or trees on the path to your home. Use bright outdoor lighting. Clear any walking paths of anything that might make someone trip, such as rocks or tools. Regularly check to see if handrails are loose or broken. Make sure that both sides of any steps have handrails. Any raised decks and porches should have guardrails on the edges. Have any leaves, snow, or ice cleared regularly. Use sand or salt on walking paths during winter. Clean up any spills in your garage right away. This includes oil or grease spills. What can I do in the bathroom? Use night lights. Install grab bars by the toilet and in the tub and shower. Do not use towel bars as grab bars. Use non-skid mats or decals in the tub or shower. If you need to sit down in the shower, use a plastic, non-slip stool. Keep the floor dry. Clean up any water that spills on the floor as soon as it happens. Remove soap buildup in the tub or shower regularly. Attach bath mats securely with double-sided non-slip rug tape. Do not have throw rugs and other things on the floor that can make you trip. What can I do in the bedroom? Use night lights. Make sure that you have a  light by your bed that is easy to reach. Do not use any sheets or blankets that are too big for your bed. They should not hang down onto the floor. Have a firm chair that has side arms. You can use this for support while you get dressed. Do not have throw rugs and other things on the floor that can make you trip. What can I do in the kitchen? Clean up any spills right away. Avoid walking on wet floors. Keep items that you use a lot in easy-to-reach places. If you need to reach something above you, use a strong step stool that has a grab bar. Keep electrical cords out of the way. Do not use floor polish or wax that makes floors slippery. If you must use wax, use non-skid floor wax. Do not have throw rugs and other things on the floor that can make you trip. What can I do with my stairs? Do not leave any items on the stairs. Make sure that there are handrails on both sides of the stairs and use them. Fix handrails that are broken or loose. Make sure that handrails are as long as the stairways. Check any carpeting to make sure that it is firmly attached to the stairs. Fix any carpet that is loose or worn. Avoid having throw rugs at the top or bottom of the stairs. If you do have throw  rugs, attach them to the floor with carpet tape. Make sure that you have a light switch at the top of the stairs and the bottom of the stairs. If you do not have them, ask someone to add them for you. What else can I do to help prevent falls? Wear shoes that: Do not have high heels. Have rubber bottoms. Are comfortable and fit you well. Are closed at the toe. Do not wear sandals. If you use a stepladder: Make sure that it is fully opened. Do not climb a closed stepladder. Make sure that both sides of the stepladder are locked into place. Ask someone to hold it for you, if possible. Clearly mark and make sure that you can see: Any grab bars or handrails. First and last steps. Where the edge of each step  is. Use tools that help you move around (mobility aids) if they are needed. These include: Canes. Walkers. Scooters. Crutches. Turn on the lights when you go into a dark area. Replace any light bulbs as soon as they burn out. Set up your furniture so you have a clear path. Avoid moving your furniture around. If any of your floors are uneven, fix them. If there are any pets around you, be aware of where they are. Review your medicines with your doctor. Some medicines can make you feel dizzy. This can increase your chance of falling. Ask your doctor what other things that you can do to help prevent falls. This information is not intended to replace advice given to you by your health care provider. Make sure you discuss any questions you have with your health care provider. Document Released: 11/20/2008 Document Revised: 07/02/2015 Document Reviewed: 02/28/2014 Elsevier Interactive Patient Education  2017 [employer].

## 2020-07-29 ENCOUNTER — Other Ambulatory Visit: Payer: Self-pay

## 2020-08-07 ENCOUNTER — Other Ambulatory Visit: Payer: Self-pay | Admitting: Family Medicine

## 2020-08-08 ENCOUNTER — Encounter: Payer: Self-pay | Admitting: Family Medicine

## 2020-08-10 ENCOUNTER — Other Ambulatory Visit: Payer: Self-pay | Admitting: Family Medicine

## 2020-12-03 ENCOUNTER — Ambulatory Visit: Payer: Medicare Other

## 2020-12-08 ENCOUNTER — Ambulatory Visit: Payer: Medicare Other

## 2020-12-08 ENCOUNTER — Ambulatory Visit (INDEPENDENT_AMBULATORY_CARE_PROVIDER_SITE_OTHER): Payer: Medicare Other

## 2020-12-08 DIAGNOSIS — Z23 Encounter for immunization: Secondary | ICD-10-CM

## 2021-01-26 DIAGNOSIS — Z1231 Encounter for screening mammogram for malignant neoplasm of breast: Secondary | ICD-10-CM | POA: Diagnosis not present

## 2021-01-26 LAB — HM MAMMOGRAPHY

## 2021-02-11 ENCOUNTER — Encounter: Payer: Self-pay | Admitting: Family Medicine

## 2021-02-12 ENCOUNTER — Ambulatory Visit: Payer: Medicare Other | Admitting: Family Medicine

## 2021-02-12 ENCOUNTER — Encounter: Payer: Self-pay | Admitting: Family Medicine

## 2021-02-12 NOTE — Progress Notes (Unsigned)
Sydney Lucas DOB: 05-Jan-1941 Encounter date: 02/12/2021  This is a 81 y.o. female who presents for complete physical   History of present illness/Additional concerns:  Osteoporosis: last dexa was 09/2019- fosamax started after this report.   Mammogram 01/2021 normal  Anxiety: paxil 10mg daily ***  Past Medical History:  Diagnosis Date   Headache(784.0)    No past surgical history on file. Allergies  Allergen Reactions   Imipramine Nausea Only and Anxiety    Blurred vision, increased heart rate,    Penicillins     REACTION: rash   Tramadol Anxiety   No outpatient medications have been marked as taking for the 02/12/21 encounter (Appointment) with Caren Macadam, MD.   Social History   Tobacco Use   Smoking status: Former    Types: Cigarettes    Quit date: 02/08/1968    Years since quitting: 53.0   Smokeless tobacco: Never  Substance Use Topics   Alcohol use: No   Family History  Problem Relation Age of Onset   Diabetes Father    CAD Father      Review of Systems  CBC:  Lab Results  Component Value Date   WBC 7.6 07/26/2019   HGB 12.3 07/26/2019   HCT 36.7 07/26/2019   MCHC 33.5 07/26/2019   RDW 13.9 07/26/2019   PLT 345.0 07/26/2019   CMP: Lab Results  Component Value Date   NA 136 03/25/2020   K 4.3 03/25/2020   CL 101 03/25/2020   CO2 28 03/25/2020   GLUCOSE 84 03/25/2020   BUN 13 03/25/2020   CREATININE 0.71 03/25/2020   CALCIUM 9.5 03/25/2020   PROT 7.1 03/25/2020   BILITOT 1.0 03/25/2020   ALKPHOS 50 03/25/2020   ALT 16 03/25/2020   AST 20 03/25/2020   LIPID: Lab Results  Component Value Date   CHOL 198 07/26/2019   TRIG 159.0 (H) 07/26/2019   HDL 47.30 07/26/2019   LDLCALC 119 (H) 07/26/2019    Objective:  There were no vitals taken for this visit.      BP Readings from Last 3 Encounters:  03/25/20 130/80  09/09/19 138/90  07/26/19 140/90   Wt Readings from Last 3 Encounters:  03/25/20 116 lb 12.8 oz (53  kg)  09/09/19 131 lb (59.4 kg)  07/26/19 134 lb 12.8 oz (61.1 kg)    Physical Exam  Assessment/Plan: Health Maintenance Due  Topic Date Due   Zoster Vaccines- Shingrix (1 of 2) Never done   Pneumonia Vaccine 51+ Years old (2 - PPSV23 if available, else PCV20) 11/01/2019   COVID-19 Vaccine (4 - Booster for [place] series) 12/04/2019   Health Maintenance reviewed - {health maintenance:[id number]}.  There are no diagnoses linked to this encounter.  No follow-ups on file.  Micheline Rough, MD

## 2021-03-12 ENCOUNTER — Encounter: Payer: Self-pay | Admitting: Family Medicine

## 2021-03-12 ENCOUNTER — Ambulatory Visit (INDEPENDENT_AMBULATORY_CARE_PROVIDER_SITE_OTHER): Payer: Medicare Other | Admitting: Family Medicine

## 2021-03-12 VITALS — BP 122/82 | HR 71 | Temp 98.4°F | Ht 61.0 in | Wt 122.7 lb

## 2021-03-12 DIAGNOSIS — M419 Scoliosis, unspecified: Secondary | ICD-10-CM

## 2021-03-12 DIAGNOSIS — M545 Low back pain, unspecified: Secondary | ICD-10-CM

## 2021-03-12 DIAGNOSIS — M810 Age-related osteoporosis without current pathological fracture: Secondary | ICD-10-CM | POA: Diagnosis not present

## 2021-03-12 DIAGNOSIS — R7301 Impaired fasting glucose: Secondary | ICD-10-CM

## 2021-03-12 DIAGNOSIS — F419 Anxiety disorder, unspecified: Secondary | ICD-10-CM | POA: Diagnosis not present

## 2021-03-12 DIAGNOSIS — G8929 Other chronic pain: Secondary | ICD-10-CM | POA: Diagnosis not present

## 2021-03-12 DIAGNOSIS — Z13220 Encounter for screening for lipoid disorders: Secondary | ICD-10-CM

## 2021-03-12 DIAGNOSIS — Z0000 Encounter for general adult medical examination without abnormal findings: Secondary | ICD-10-CM | POA: Diagnosis not present

## 2021-03-12 NOTE — Progress Notes (Signed)
Sydney Lucas DOB: 08/28/1940 Encounter date: 03/12/2021  This is a 81 y.o. female who presents for complete physical   History of present illness/Additional concerns: Sister passed in October. 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 Son comes to help sometimes, but lives out of town. Mom lives alone and won't let anyone else help her. She is just worried about stress getting to her and having another "breakdown"  Osteoporosis: repeat dexa 08/2021; doing well on fosamax.   Tries to exercise, walk dog. Takes time to read, relax. She does not meal prep for mom; mom mostly does nutrition drinks now. She does cook everything for husband. Makes 3 meals/day.   Bp is usually well controlled. She was rushing when she first got here.    She does complain of some lower back pain.  She feels like this is worse with caregiving for both her husband and her mother.  She has to help her husband with position changes and transfers and this tends to strain her back. Past Medical History:  Diagnosis Date   Headache(784.0)    History reviewed. No pertinent surgical history. Allergies  Allergen Reactions   Imipramine Nausea Only and Anxiety    Blurred vision, increased heart rate,    Penicillins     REACTION: rash   Tramadol Anxiety   Current Meds  Medication Sig   alendronate (FOSAMAX) 70 MG tablet TAKE 1 TABLET BY MOUTH  WEEKLY WITH 8 OZ OF PLAIN  WATER 30 MINUTES BEFORE  FIRST FOOD, DRINK OR MEDS.  STAY UPRIGHT FOR 30 MINS   Calcium Carbonate-Vit D-Min (CALTRATE 600+D PLUS) 600-400 MG-UNIT per tablet Take 1 tablet by mouth daily.   Multiple Vitamin (MULTIVITAMIN) tablet Take 1 tablet by mouth daily.   PARoxetine (PAXIL) 10 MG tablet TAKE 1 TABLET BY MOUTH  DAILY   psyllium (REGULOID) 0.52 g capsule Take 0.52 g by mouth daily.   Social History   Tobacco Use   Smoking status: Former    Types: Cigarettes    Quit date: 02/08/1968    Years since quitting: 53.1   Smokeless tobacco:  Never  Substance Use Topics   Alcohol use: No   Family History  Problem Relation Age of Onset   Diabetes Father    CAD Father      Review of Systems  Constitutional:  Negative for activity change, appetite change, chills, fatigue, fever and unexpected weight change.  HENT:  Negative for congestion, ear pain, hearing loss, sinus pressure, sinus pain, sore throat and trouble swallowing.   Eyes:  Negative for pain and visual disturbance.  Respiratory:  Negative for cough, chest tightness, shortness of breath and wheezing.   Cardiovascular:  Negative for chest pain, palpitations and leg swelling.  Gastrointestinal:  Negative for abdominal pain, blood in stool, constipation, diarrhea, nausea and vomiting.  Genitourinary:  Negative for difficulty urinating and menstrual problem.  Musculoskeletal:  Positive for back pain (low back bothers her; she has to help husband with transfers and this flares her back.). Negative for arthralgias.  Skin:  Negative for rash.  Neurological:  Negative for dizziness, weakness, numbness and headaches.  Hematological:  Negative for adenopathy. Does not bruise/bleed easily.  Psychiatric/Behavioral:  Negative for sleep disturbance and suicidal ideas. The patient is not nervous/anxious.    CBC:  Lab Results  Component Value Date   WBC 7.6 07/26/2019   HGB 12.3 07/26/2019   HCT 36.7 07/26/2019   MCHC 33.5 07/26/2019   RDW 13.9 07/26/2019   PLT  345.0 07/26/2019   CMP: Lab Results  Component Value Date   NA 136 03/25/2020   K 4.3 03/25/2020   CL 101 03/25/2020   CO2 28 03/25/2020   GLUCOSE 84 03/25/2020   BUN 13 03/25/2020   CREATININE 0.71 03/25/2020   CALCIUM 9.5 03/25/2020   PROT 7.1 03/25/2020   BILITOT 1.0 03/25/2020   ALKPHOS 50 03/25/2020   ALT 16 03/25/2020   AST 20 03/25/2020   LIPID: Lab Results  Component Value Date   CHOL 198 07/26/2019   TRIG 159.0 (H) 07/26/2019   HDL 47.30 07/26/2019   LDLCALC 119 (H) 07/26/2019     Objective:  BP 122/82 (BP Location: Left Arm, Cuff Size: Normal)    Pulse 71    Temp 98.4 F (36.9 C) (Oral)    Ht 5\' 1"  (1.549 m)    Wt 122 lb 11.2 oz (55.7 kg)    SpO2 96%    BMI 23.18 kg/m   Weight: 122 lb 11.2 oz (55.7 kg)   BP Readings from Last 3 Encounters:  03/12/21 122/82  03/25/20 130/80  09/09/19 138/90   Wt Readings from Last 3 Encounters:  03/12/21 122 lb 11.2 oz (55.7 kg)  03/25/20 116 lb 12.8 oz (53 kg)  09/09/19 131 lb (59.4 kg)    Physical Exam Constitutional:      General: She is not in acute distress.    Appearance: She is well-developed.  HENT:     Head: Normocephalic and atraumatic.     Right Ear: External ear normal.     Left Ear: External ear normal.     Mouth/Throat:     Pharynx: No oropharyngeal exudate.  Eyes:     Conjunctiva/sclera: Conjunctivae normal.     Pupils: Pupils are equal, round, and reactive to light.  Neck:     Thyroid: No thyromegaly.  Cardiovascular:     Rate and Rhythm: Normal rate and regular rhythm.     Heart sounds: Normal heart sounds. No murmur heard.   No friction rub. No gallop.  Pulmonary:     Effort: Pulmonary effort is normal.     Breath sounds: Normal breath sounds.  Abdominal:     General: Bowel sounds are normal. There is no distension.     Palpations: Abdomen is soft. There is no mass.     Tenderness: There is no abdominal tenderness. There is no guarding.     Hernia: No hernia is present.  Musculoskeletal:        General: No tenderness or deformity. Normal range of motion.     Cervical back: Normal range of motion and neck supple.     Comments: Mild thoracic scoliosis and mild kyphosis. No significant decrease in range of motion today on back exam, but she does have flares on and off with pain.   Lymphadenopathy:     Cervical: No cervical adenopathy.  Skin:    General: Skin is warm and dry.     Findings: No rash.  Neurological:     Mental Status: She is alert and oriented to person, place, and time.      Deep Tendon Reflexes: Reflexes normal.     Reflex Scores:      Tricep reflexes are 2+ on the right side and 2+ on the left side.      Bicep reflexes are 2+ on the right side and 2+ on the left side.      Brachioradialis reflexes are 2+ on the right side and 2+  on the left side.      Patellar reflexes are 2+ on the right side and 2+ on the left side. Psychiatric:        Speech: Speech normal.        Behavior: Behavior normal.        Thought Content: Thought content normal.    Assessment/Plan: Health Maintenance Due  Topic Date Due   Pneumonia Vaccine 49+ Years old (2 - PPSV23 if available, else PCV20) 11/01/2019   COVID-19 Vaccine (4 - Booster for Pfizer series) 12/04/2019   Health Maintenance reviewed - dexa in 08/2021; declines prevnar and shingrix.  1. Preventative health care Keep up with regular exercise and healthy eating.  Encourage patient to make sure she is taking time for stress management for herself since she is caregiving for both her husband and her mother.  2. Anxiety Anxiety is currently well controlled on Paxil.  Although this would not be my first choice due to her age, she had difficulty with tolerating BuSpar and Celexa and is doing well on this low-dose of Paxil.  This was started by psychiatry.  3. Impaired fasting glucose We will check lab work.  4. Age related osteoporosis, unspecified pathological fracture presence Check vitamin D levels.  She is tolerating Fosamax.  Continue with this treatment.  5. Chronic low back pain without sciatica, unspecified back pain laterality Refer to physical therapy.  She is interested in helping to strengthening and support the spine and help herself through some of the lifting and assisting that she does with her caregiving. - Ambulatory referral to Physical Therapy  6. Scoliosis of thoracic spine, unspecified scoliosis type See above. - Ambulatory referral to Physical Therapy  Return in about 6 months (around  09/09/2021) for Chronic condition visit.  Micheline Rough, MD

## 2021-03-12 NOTE — Patient Instructions (Signed)
Consider shingrix at pharmacy. You can also consider prevnar 20 if desired.

## 2021-03-16 ENCOUNTER — Telehealth: Payer: Self-pay | Admitting: *Deleted

## 2021-03-16 ENCOUNTER — Encounter: Payer: Self-pay | Admitting: Family Medicine

## 2021-03-16 NOTE — Telephone Encounter (Signed)
Left a detailed message at the patient's cell number with the information below.  Requested she call back to schedule a fasting lab appt.

## 2021-03-16 NOTE — Telephone Encounter (Signed)
-----   Message from Caren Macadam, MD sent at 03/14/2021 11:41 AM EST ----- I ordered blood work for patient that she can get completed prior to her next appointment.  This very results can be discussed at her visit.  I forgot to let her know this on her way out.

## 2021-04-14 ENCOUNTER — Ambulatory Visit: Payer: Medicare Other

## 2021-05-11 ENCOUNTER — Encounter: Payer: Self-pay | Admitting: Family Medicine

## 2021-06-03 ENCOUNTER — Other Ambulatory Visit (INDEPENDENT_AMBULATORY_CARE_PROVIDER_SITE_OTHER): Payer: Medicare Other

## 2021-06-03 DIAGNOSIS — Z13220 Encounter for screening for lipoid disorders: Secondary | ICD-10-CM

## 2021-06-03 DIAGNOSIS — R7301 Impaired fasting glucose: Secondary | ICD-10-CM | POA: Diagnosis not present

## 2021-06-03 DIAGNOSIS — M810 Age-related osteoporosis without current pathological fracture: Secondary | ICD-10-CM

## 2021-06-03 DIAGNOSIS — M545 Low back pain, unspecified: Secondary | ICD-10-CM | POA: Diagnosis not present

## 2021-06-03 DIAGNOSIS — G8929 Other chronic pain: Secondary | ICD-10-CM | POA: Diagnosis not present

## 2021-06-03 LAB — CBC WITH DIFFERENTIAL/PLATELET
Basophils Absolute: 0.1 10*3/uL (ref 0.0–0.1)
Basophils Relative: 1.4 % (ref 0.0–3.0)
Eosinophils Absolute: 0.3 10*3/uL (ref 0.0–0.7)
Eosinophils Relative: 3.4 % (ref 0.0–5.0)
HCT: 37.7 % (ref 36.0–46.0)
Hemoglobin: 12.7 g/dL (ref 12.0–15.0)
Lymphocytes Relative: 33.4 % (ref 12.0–46.0)
Lymphs Abs: 3 10*3/uL (ref 0.7–4.0)
MCHC: 33.8 g/dL (ref 30.0–36.0)
MCV: 91.9 fl (ref 78.0–100.0)
Monocytes Absolute: 0.8 10*3/uL (ref 0.1–1.0)
Monocytes Relative: 9.1 % (ref 3.0–12.0)
Neutro Abs: 4.8 10*3/uL (ref 1.4–7.7)
Neutrophils Relative %: 52.7 % (ref 43.0–77.0)
Platelets: 269 10*3/uL (ref 150.0–400.0)
RBC: 4.1 Mil/uL (ref 3.87–5.11)
RDW: 13.8 % (ref 11.5–15.5)
WBC: 9 10*3/uL (ref 4.0–10.5)

## 2021-06-03 LAB — COMPREHENSIVE METABOLIC PANEL
ALT: 19 U/L (ref 0–35)
AST: 25 U/L (ref 0–37)
Albumin: 4.4 g/dL (ref 3.5–5.2)
Alkaline Phosphatase: 50 U/L (ref 39–117)
BUN: 12 mg/dL (ref 6–23)
CO2: 26 mEq/L (ref 19–32)
Calcium: 9.7 mg/dL (ref 8.4–10.5)
Chloride: 101 mEq/L (ref 96–112)
Creatinine, Ser: 0.77 mg/dL (ref 0.40–1.20)
GFR: 71.82 mL/min (ref >60.00)
Glucose, Bld: 89 mg/dL (ref 70–99)
Potassium: 4.2 mEq/L (ref 3.5–5.1)
Sodium: 135 mEq/L (ref 135–145)
Total Bilirubin: 0.9 mg/dL (ref 0.2–1.2)
Total Protein: 7.3 g/dL (ref 6.0–8.3)

## 2021-06-03 LAB — LIPID PANEL
Cholesterol: 197 mg/dL (ref 0–200)
HDL: 65.7 mg/dL (ref >39.00)
LDL Cholesterol: 100 mg/dL — ABNORMAL HIGH (ref 0–99)
NonHDL: 131.17
Total CHOL/HDL Ratio: 3
Triglycerides: 154 mg/dL — ABNORMAL HIGH (ref 0.0–149.0)
VLDL: 30.8 mg/dL (ref 0.0–40.0)

## 2021-06-03 LAB — VITAMIN D 25 HYDROXY (VIT D DEFICIENCY, FRACTURES): VITD: 40.72 ng/mL (ref 30.00–100.00)

## 2021-06-03 LAB — HEMOGLOBIN A1C: Hgb A1c MFr Bld: 6.1 % (ref 4.6–6.5)

## 2021-07-21 ENCOUNTER — Other Ambulatory Visit: Payer: Self-pay | Admitting: *Deleted

## 2021-07-21 MED ORDER — ALENDRONATE SODIUM 70 MG PO TABS
ORAL_TABLET | ORAL | 3 refills | Status: DC
Start: 2021-07-21 — End: 2022-06-13

## 2021-07-27 ENCOUNTER — Telehealth: Payer: Self-pay | Admitting: Family Medicine

## 2021-07-27 NOTE — Telephone Encounter (Signed)
Correction   No answer  could not leave message

## 2021-07-27 NOTE — Telephone Encounter (Signed)
Left message for patient to call back and schedule Medicare Annual Wellness Visit (AWV) either virtually or in office. Left  my jabber number [phone]   Last AWV ;07/28/20  please schedule at anytime with [facility] Nurse Health Advisor 1 or 2    

## 2021-07-28 ENCOUNTER — Other Ambulatory Visit: Payer: Self-pay | Admitting: *Deleted

## 2021-07-28 MED ORDER — PAROXETINE HCL 10 MG PO TABS: ORAL_TABLET | ORAL | 0 refills | Status: DC

## 2021-07-29 ENCOUNTER — Telehealth: Payer: Self-pay | Admitting: Family Medicine

## 2021-07-29 NOTE — Telephone Encounter (Signed)
Tried calling patient to schedule Medicare Annual Wellness Visit (AWV) either virtually or in office. Left  my Herbie Drape number [phone]  No answer  Last AWV 07/29/21 ; please schedule at anytime with [facility] Nurse Health Advisor 1 or 2

## 2021-08-19 ENCOUNTER — Telehealth: Payer: Self-pay | Admitting: Family Medicine

## 2021-08-19 NOTE — Telephone Encounter (Signed)
Left message for patient to call back and schedule Medicare Annual Wellness Visit (AWV) either virtually or in office. Left  my jabber number [phone]   Last AWV ;07/28/20  please schedule at anytime with [facility] Nurse Health Advisor 1 or 2    

## 2021-09-29 ENCOUNTER — Ambulatory Visit (INDEPENDENT_AMBULATORY_CARE_PROVIDER_SITE_OTHER): Payer: Medicare Other

## 2021-09-29 VITALS — Ht 61.0 in | Wt 122.0 lb

## 2021-09-29 DIAGNOSIS — Z0000 Encounter for general adult medical examination without abnormal findings: Secondary | ICD-10-CM | POA: Diagnosis not present

## 2021-09-29 NOTE — Patient Instructions (Addendum)
Sydney Lucas , Thank you for taking time to come for your Medicare Wellness Visit. I appreciate your ongoing commitment to your health goals. Please review the following plan we discussed and let me know if I can assist you in the future.   These are the goals we discussed:  Goals       Exercise 3x per week (30 min per time)      Lose Weight (pt-stated)      Get better posture.        This is a list of the screening recommended for you and due dates:  Health Maintenance  Topic Date Due   Flu Shot  09/07/2021   COVID-19 Vaccine (4 - Pfizer series) 10/15/2021*   Zoster (Shingles) Vaccine (1 of 2) 12/30/2021*   Pneumonia Vaccine (2 - PPSV23 or PCV20) 09/30/2022*   Tetanus Vaccine  09/29/2024*   Mammogram  01/26/2022   DEXA scan (bone density measurement)  Completed   HPV Vaccine  Aged Out  *Topic was postponed. The date shown is not the original due date.    Advanced directives: Yes  Conditions/risks identified: None  Next appointment: Follow up in one year for your annual wellness visit     Preventive Care 65 Years and Older, Female Preventive care refers to lifestyle choices and visits with your health care provider that can promote health and wellness. What does preventive care include? A yearly physical exam. This is also called an annual well check. Dental exams once or twice a year. Routine eye exams. Ask your health care provider how often you should have your eyes checked. Personal lifestyle choices, including: Daily care of your teeth and gums. Regular physical activity. Eating a healthy diet. Avoiding tobacco and drug use. Limiting alcohol use. Practicing safe sex. Taking low-dose aspirin every day. Taking vitamin and mineral supplements as recommended by your health care provider. What happens during an annual well check? The services and screenings done by your health care provider during your annual well check will depend on your age, overall  health, lifestyle risk factors, and family history of disease. Counseling  Your health care provider may ask you questions about your: Alcohol use. Tobacco use. Drug use. Emotional well-being. Home and relationship well-being. Sexual activity. Eating habits. History of falls. Memory and ability to understand (cognition). Work and work Statistician. Reproductive health. Screening  You may have the following tests or measurements: Height, weight, and BMI. Blood pressure. Lipid and cholesterol levels. These may be checked every 5 years, or more frequently if you are over 69 years old. Skin check. Lung cancer screening. You may have this screening every year starting at age 19 if you have a 30-pack-year history of smoking and currently smoke or have quit within the past 15 years. Fecal occult blood test (FOBT) of the stool. You may have this test every year starting at age 38. Flexible sigmoidoscopy or colonoscopy. You may have a sigmoidoscopy every 5 years or a colonoscopy every 10 years starting at age 51. Hepatitis C blood test. Hepatitis B blood test. Sexually transmitted disease (STD) testing. Diabetes screening. This is done by checking your blood sugar (glucose) after you have not eaten for a while (fasting). You may have this done every 1-3 years. Bone density scan. This is done to screen for osteoporosis. You may have this done starting at age 50. Mammogram. This may be done every 1-2 years. Talk to your health care provider about how often you should have regular mammograms. Talk  with your health care provider about your test results, treatment options, and if necessary, the need for more tests. Vaccines  Your health care provider may recommend certain vaccines, such as: Influenza vaccine. This is recommended every year. Tetanus, diphtheria, and acellular pertussis (Tdap, Td) vaccine. You may need a Td booster every 10 years. Zoster vaccine. You may need this after age  57. Pneumococcal 13-valent conjugate (PCV13) vaccine. One dose is recommended after age 40. Pneumococcal polysaccharide (PPSV23) vaccine. One dose is recommended after age 70. Talk to your health care provider about which screenings and vaccines you need and how often you need them. This information is not intended to replace advice given to you by your health care provider. Make sure you discuss any questions you have with your health care provider. Document Released: 02/20/2015 Document Revised: 10/14/2015 Document Reviewed: 11/25/2014 Elsevier Interactive Patient Education  2017 [place] Prevention in the Home Falls can cause injuries. They can happen to people of all ages. There are many things you can do to make your home safe and to help prevent falls. What can I do on the outside of my home? Regularly fix the edges of walkways and driveways and fix any cracks. Remove anything that might make you trip as you walk through a door, such as a raised step or threshold. Trim any bushes or trees on the path to your home. Use bright outdoor lighting. Clear any walking paths of anything that might make someone trip, such as rocks or tools. Regularly check to see if handrails are loose or broken. Make sure that both sides of any steps have handrails. Any raised decks and porches should have guardrails on the edges. Have any leaves, snow, or ice cleared regularly. Use sand or salt on walking paths during winter. Clean up any spills in your garage right away. This includes oil or grease spills. What can I do in the bathroom? Use night lights. Install grab bars by the toilet and in the tub and shower. Do not use towel bars as grab bars. Use non-skid mats or decals in the tub or shower. If you need to sit down in the shower, use a plastic, non-slip stool. Keep the floor dry. Clean up any water that spills on the floor as soon as it happens. Remove soap buildup in the tub or shower  regularly. Attach bath mats securely with double-sided non-slip rug tape. Do not have throw rugs and other things on the floor that can make you trip. What can I do in the bedroom? Use night lights. Make sure that you have a light by your bed that is easy to reach. Do not use any sheets or blankets that are too big for your bed. They should not hang down onto the floor. Have a firm chair that has side arms. You can use this for support while you get dressed. Do not have throw rugs and other things on the floor that can make you trip. What can I do in the kitchen? Clean up any spills right away. Avoid walking on wet floors. Keep items that you use a lot in easy-to-reach places. If you need to reach something above you, use a strong step stool that has a grab bar. Keep electrical cords out of the way. Do not use floor polish or wax that makes floors slippery. If you must use wax, use non-skid floor wax. Do not have throw rugs and other things on the floor that can make you  trip. What can I do with my stairs? Do not leave any items on the stairs. Make sure that there are handrails on both sides of the stairs and use them. Fix handrails that are broken or loose. Make sure that handrails are as long as the stairways. Check any carpeting to make sure that it is firmly attached to the stairs. Fix any carpet that is loose or worn. Avoid having throw rugs at the top or bottom of the stairs. If you do have throw rugs, attach them to the floor with carpet tape. Make sure that you have a light switch at the top of the stairs and the bottom of the stairs. If you do not have them, ask someone to add them for you. What else can I do to help prevent falls? Wear shoes that: Do not have high heels. Have rubber bottoms. Are comfortable and fit you well. Are closed at the toe. Do not wear sandals. If you use a stepladder: Make sure that it is fully opened. Do not climb a closed stepladder. Make sure that  both sides of the stepladder are locked into place. Ask someone to hold it for you, if possible. Clearly mark and make sure that you can see: Any grab bars or handrails. First and last steps. Where the edge of each step is. Use tools that help you move around (mobility aids) if they are needed. These include: Canes. Walkers. Scooters. Crutches. Turn on the lights when you go into a dark area. Replace any light bulbs as soon as they burn out. Set up your furniture so you have a clear path. Avoid moving your furniture around. If any of your floors are uneven, fix them. If there are any pets around you, be aware of where they are. Review your medicines with your doctor. Some medicines can make you feel dizzy. This can increase your chance of falling. Ask your doctor what other things that you can do to help prevent falls. This information is not intended to replace advice given to you by your health care provider. Make sure you discuss any questions you have with your health care provider. Document Released: 11/20/2008 Document Revised: 07/02/2015 Document Reviewed: 02/28/2014 Elsevier Interactive Patient Education  2017 [employer].

## 2021-09-29 NOTE — Progress Notes (Signed)
Subjective:   Sydney Lucas is a 81 y.o. female who presents for Medicare Annual (Subsequent) preventive examination.  Review of Systems    Virtual Visit via Telephone Note  I connected with  SHARNELL KNIGHT on 09/29/21 at  9:30 AM EDT by telephone and verified that I am speaking with the correct person using two identifiers.  Location: Patient: Home Provider: Office Persons participating in the virtual visit: patient/Nurse Health Advisor   I discussed the limitations, risks, security and privacy concerns of performing an evaluation and management service by telephone and the availability of in person appointments. The patient expressed understanding and agreed to proceed.  Interactive audio and video telecommunications were attempted between this nurse and patient, however failed, due to patient having technical difficulties OR patient did not have access to video capability.  We continued and completed visit with audio only.  Some vital signs may be absent or patient reported.   Criselda Peaches, LPN  Cardiac Risk Factors include: advanced age (>18mn, >>32women);Other (see comment), Risk factor comments: AFIB     Objective:    Today's Vitals   09/29/21 0927  Weight: 122 lb (55.3 kg)  Height: '5\' 1"'$  (1.549 m)   Body mass index is 23.05 kg/m.     09/29/2021    9:35 AM 07/28/2020    1:10 PM 09/01/2016   11:05 AM  Advanced Directives  Does Patient Have a Medical Advance Directive? Yes Yes Yes  Type of [place] will [place] will   Does patient want to make changes to medical advance directive? No - Patient declined  No - Patient declined  Copy of [place] Chart? No - copy requested No - copy requested     Current Medications (verified) Outpatient Encounter Medications as of 09/29/2021  Medication Sig   alendronate (FOSAMAX) 70 MG tablet TAKE 1 TABLET BY  MOUTH  WEEKLY WITH 8 OZ OF PLAIN  WATER 30 MINUTES BEFORE  FIRST FOOD, DRINK OR MEDS.  STAY UPRIGHT FOR 30 MINS   Calcium Carbonate-Vit D-Min (CALTRATE 600+D PLUS) 600-400 MG-UNIT per tablet Take 1 tablet by mouth daily.   Multiple Vitamin (MULTIVITAMIN) tablet Take 1 tablet by mouth daily.   PARoxetine (PAXIL) 10 MG tablet TAKE 1 TABLET BY MOUTH  DAILY   psyllium (REGULOID) 0.52 g capsule Take 0.52 g by mouth daily.   No facility-administered encounter medications on file as of 09/29/2021.    Allergies (verified) Imipramine, Penicillins, and Tramadol   History: Past Medical History:  Diagnosis Date   Headache(784.0)    History reviewed. No pertinent surgical history. Family History  Problem Relation Age of Onset   Diabetes Father    CAD Father    Social History   Socioeconomic History   Marital status: Married    Spouse name: Not on file   Number of children: Not on file   Years of education: Not on file   Highest education level: Not on file  Occupational History   Not on file  Tobacco Use   Smoking status: Former    Types: Cigarettes    Quit date: 02/08/1968    Years since quitting: 53.6   Smokeless tobacco: Never  Substance and Sexual Activity   Alcohol use: No   Drug use: No   Sexual activity: Not on file  Other Topics Concern   Not on file  Social History Narrative   Not on file  Social Determinants of Health   Financial Resource Strain: Low Risk  (09/29/2021)   Overall Financial Resource Strain (CARDIA)    Difficulty of Paying Living Expenses: Not hard at all  Food Insecurity: No Food Insecurity (09/29/2021)   Hunger Vital Sign    Worried About Running Out of Food in the Last Year: Never true    Ran Out of Food in the Last Year: Never true  Transportation Needs: No Transportation Needs (09/29/2021)   PRAPARE - Hydrologist (Medical): No    Lack of Transportation (Non-Medical): No  Physical Activity: Sufficiently Active  (09/29/2021)   Exercise Vital Sign    Days of Exercise per Week: 7 days    Minutes of Exercise per Session: 30 min  Stress: No Stress Concern Present (09/29/2021)   [place]    Feeling of Stress : Not at all  Social Connections: [place] (09/29/2021)   Social Connection and Isolation Panel [NHANES]    Frequency of Communication with Friends and Family: More than three times a week    Frequency of Social Gatherings with Friends and Family: More than three times a week    Attends Religious Services: More than 4 times per year    Active Member of [employer] or Organizations: Yes    Attends Music therapist: More than 4 times per year    Marital Status: Married    Tobacco Counseling Counseling given: Not Answered   Clinical Intake:  Pre-visit preparation completed: No  Pain : No/denies pain     BMI - recorded: 23.2 Nutritional Status: BMI of 19-24  Normal Nutritional Risks: None Diabetes: No  How often do you need to have someone help you when you read instructions, pamphlets, or other written materials from your doctor or pharmacy?: 1 - Never  Diabetic?  No  Interpreter Needed?: No  Information entered by :: Rolene Arbour LPN   Activities of Daily Living    09/29/2021    9:32 AM  In your present state of health, do you have any difficulty performing the following activities:  Hearing? 0  Vision? 0  Difficulty concentrating or making decisions? 0  Walking or climbing stairs? 0  Dressing or bathing? 0  Doing errands, shopping? 0  Preparing Food and eating ? N  Using the Toilet? N  In the past six months, have you accidently leaked urine? N  Do you have problems with loss of bowel control? N  Managing your Medications? N  Managing your Finances? N  Housekeeping or managing your Housekeeping? N    Patient Care Team: Caren Macadam, MD (Inactive) as PCP - General (Family  Medicine)  Indicate any recent Medical Services you may have received from other than [facility] providers in the past year (date may be approximate).     Assessment:   This is a routine wellness examination for Lumi.  Hearing/Vision screen Hearing Screening - Comments:: No hearing difficulty Vision Screening - Comments:: Wears reading glasses. Followed by [facility]  Dietary issues and exercise activities discussed: Exercise limited by: None identified   Goals Addressed               This Visit's Progress     Lose Weight (pt-stated)        Get better posture.       Depression Screen    09/29/2021    9:31 AM 03/12/2021    2:35 PM 07/28/2020  1:11 PM 07/28/2020    1:08 PM 07/26/2019    8:01 AM 01/30/2018    8:02 AM 01/06/2016   11:20 AM  PHQ 2/9 Scores  PHQ - 2 Score 0 0 0 0 0 1 0  PHQ- 9 Score  1   0      Fall Risk    09/29/2021    9:33 AM 03/12/2021    1:29 PM 07/28/2020    1:11 PM 07/26/2019    8:01 AM 01/30/2018    8:02 AM  [place] in the past year? 0 0 0 0 1  Number falls in past yr: 0  0 0 0  Injury with Fall? 0  0 0 1  Risk for fall due to : No Fall Risks      Follow up   Falls evaluation completed      FALL RISK PREVENTION PERTAINING TO THE HOME:  Any stairs in or around the home? No  If so, are there any without handrails? No  Home free of loose throw rugs in walkways, pet beds, electrical cords, etc? Yes  Adequate lighting in your home to reduce risk of falls? Yes   ASSISTIVE DEVICES UTILIZED TO PREVENT FALLS:  Life alert? No  Use of a cane, walker or w/c? No  Grab bars in the bathroom? Yes  Shower chair or bench in shower? Yes  Elevated toilet seat or a handicapped toilet? No   TIMED UP AND GO:  Was the test performed? No . Audio Visit  Cognitive Function:        09/29/2021    9:35 AM  6CIT Screen  What Year? 0 points  What month? 0 points  What time? 0 points  Count back from 20 0 points  Months in reverse 0 points   Repeat phrase 0 points  Total Score 0 points    Immunizations Immunization History  Administered Date(s) Administered   Fluad Quad(high Dose 65+) 11/01/2018, 12/08/2020   Influenza, High Dose Seasonal PF 11/24/2016, 11/09/2017   Influenza-Unspecified 01/22/2015, 01/08/2016, 11/08/2019   PFIZER(Purple Top)SARS-COV-2 Vaccination 02/20/2019, 03/12/2019, 10/09/2019   Pneumococcal Conjugate-13 11/01/2018   Td 02/08/1996    TDAP status: Up to date  Flu Vaccine status: Up to date  Pneumococcal vaccine status: Declined,  Education has been provided regarding the importance of this vaccine but patient still declined. Advised may receive this vaccine at local pharmacy or Health Dept. Aware to provide a copy of the vaccination record if obtained from local pharmacy or Health Dept. Verbalized acceptance and understanding.   Covid-19 vaccine status: Completed vaccines  Qualifies for Shingles Vaccine? Yes   Zostavax completed No   Shingrix Completed?: No.    Education has been provided regarding the importance of this vaccine. Patient has been advised to call insurance company to determine out of pocket expense if they have not yet received this vaccine. Advised may also receive vaccine at local pharmacy or Health Dept. Verbalized acceptance and understanding.  Screening Tests Health Maintenance  Topic Date Due   INFLUENZA VACCINE  09/07/2021   COVID-19 Vaccine (4 - Pfizer series) 10/15/2021 (Originally 12/04/2019)   Zoster Vaccines- Shingrix (1 of 2) 12/30/2021 (Originally 12/18/1988)   Pneumonia Vaccine 95+ Years old (2 - PPSV23 or PCV20) 09/30/2022 (Originally 11/01/2019)   TETANUS/TDAP  09/29/2024 (Originally 02/07/2006)   MAMMOGRAM  01/26/2022   DEXA SCAN  Completed   HPV VACCINES  Aged Out    Health Maintenance  Health Maintenance Due  Topic  Date Due   INFLUENZA VACCINE  09/07/2021    Colorectal cancer screening: No longer required.   Mammogram status: Completed 01/26/21.  Repeat every year  Bone Density status: Completed 08/23/19. Results reflect: Bone density results: OSTEOPOROSIS. Repeat every   years.  Lung Cancer Screening: (Low Dose CT Chest recommended if Age 75-80 years, 30 pack-year currently smoking OR have quit w/in 15years.) does not qualify.     Additional Screening:  Hepatitis C Screening: does not qualify; Completed   Vision Screening: Recommended annual ophthalmology exams for early detection of glaucoma and other disorders of the eye. Is the patient up to date with their annual eye exam?  Yes  Who is the provider or what is the name of the office in which the patient attends annual eye exams? [facility] If pt is not established with a provider, would they like to be referred to a provider to establish care? No .   Dental Screening: Recommended annual dental exams for proper oral hygiene  Community Resource Referral / Chronic Care Management:  CRR required this visit?  No   CCM required this visit?  No      Plan:     I have personally reviewed and noted the following in the patient's chart:   Medical and social history Use of alcohol, tobacco or illicit drugs  Current medications and supplements including opioid prescriptions. Patient is not currently taking opioid prescriptions. Functional ability and status Nutritional status Physical activity Advanced directives List of other physicians Hospitalizations, surgeries, and ER visits in previous 12 months Vitals Screenings to include cognitive, depression, and falls Referrals and appointments  In addition, I have reviewed and discussed with patient certain preventive protocols, quality metrics, and best practice recommendations. A written personalized care plan for preventive services as well as general preventive health recommendations were provided to patient.     Criselda Peaches, LPN   8/34/1964   Nurse Notes: None

## 2021-10-16 ENCOUNTER — Other Ambulatory Visit: Payer: Self-pay | Admitting: Family

## 2021-11-22 ENCOUNTER — Telehealth: Payer: Self-pay | Admitting: Family Medicine

## 2021-11-22 NOTE — Telephone Encounter (Signed)
Pt called, returning CMA's call. CMA was off. Pt asked that CMA call back at their earliest convenience.

## 2021-11-23 NOTE — Telephone Encounter (Signed)
I called the patient for more information as I do not recall leaving a message for her.  Patient stated it was Sydney Lucas with Cory's office that called her.  Message forwarded to [facility].

## 2021-11-23 NOTE — Telephone Encounter (Signed)
Phone call was intended for pt spouse.

## 2021-12-07 ENCOUNTER — Other Ambulatory Visit: Payer: Self-pay

## 2021-12-07 MED ORDER — INFLUENZA VAC A&B SA ADJ QUAD 0.5 ML IM PRSY
0.5000 mL | PREFILLED_SYRINGE | INTRAMUSCULAR | 0 refills | Status: AC
  Filled 2021-12-07: qty 0.5, 1d supply, fill #0

## 2021-12-23 ENCOUNTER — Encounter: Payer: Self-pay | Admitting: Family Medicine

## 2021-12-23 ENCOUNTER — Ambulatory Visit (INDEPENDENT_AMBULATORY_CARE_PROVIDER_SITE_OTHER): Payer: Medicare Other | Admitting: Family Medicine

## 2021-12-23 VITALS — BP 142/70 | HR 75 | Temp 98.1°F | Ht 61.0 in | Wt 127.1 lb

## 2021-12-23 DIAGNOSIS — Z1231 Encounter for screening mammogram for malignant neoplasm of breast: Secondary | ICD-10-CM

## 2021-12-23 DIAGNOSIS — Z780 Asymptomatic menopausal state: Secondary | ICD-10-CM

## 2021-12-23 DIAGNOSIS — F419 Anxiety disorder, unspecified: Secondary | ICD-10-CM

## 2021-12-23 DIAGNOSIS — Z23 Encounter for immunization: Secondary | ICD-10-CM | POA: Diagnosis not present

## 2021-12-23 DIAGNOSIS — M810 Age-related osteoporosis without current pathological fracture: Secondary | ICD-10-CM

## 2021-12-23 DIAGNOSIS — B351 Tinea unguium: Secondary | ICD-10-CM | POA: Diagnosis not present

## 2021-12-23 MED ORDER — PAROXETINE HCL 10 MG PO TABS: ORAL_TABLET | ORAL | 3 refills | Status: DC

## 2021-12-23 MED ORDER — TERBINAFINE HCL 250 MG PO TABS: 250.0000 mg | ORAL_TABLET | Freq: Every day | ORAL | 0 refills | Status: DC

## 2021-12-23 NOTE — Progress Notes (Signed)
Established Patient Office Visit  Subjective   Patient ID: Sydney Lucas, female    DOB: 10-11-1940  Age: 81 y.o. MRN: [id number]  Chief Complaint  Patient presents with   Establish Care   Medication Refill    Patient requests refills on Paroxetine    Patient is here for transition of care visit. 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 States that they had a rough morning.  She reports that her husband can transfer to a wheelchair with a lot of help from her but other than that he has trouble with most other ADL's.   Patient is reporting having a fungal infection in her toenail, in the left foot 4th toe. She thinks it is also spreading to the 2nd toe. Reports it has been there for several months, no pain or ingrown nails, reports it is thickened, yellow and very brittle.  Anxiety-- currently on paroxetine 10 mg daily, pt reports this medication is working well for her. Denies any side effects currently. Would like to continue the medication.  Osteoporosis-- reviewed DEXA from 2021. She was started on fosamax 70 mg weekly at that time. She reports no side effects to this medication, we discussed ordering a follow up DEXA scan and she is agreeable.   We reviewed her HM and she needs a second pneumonia vaccine, also needs her mammogram in December. Orders placed.    Current Outpatient Medications  Medication Instructions   alendronate (FOSAMAX) 70 MG tablet TAKE 1 TABLET BY MOUTH  WEEKLY WITH 8 OZ OF PLAIN  WATER 30 MINUTES BEFORE  FIRST FOOD, DRINK OR MEDS.  STAY UPRIGHT FOR 30 MINS   Calcium Carbonate-Vit D-Min (CALTRATE 600+D PLUS) 600-400 MG-UNIT per tablet 1 tablet, Oral, Daily,     influenza vaccine adjuvanted (FLUAD) 0.5 ML injection 0.5 mLs, Intramuscular   Multiple Vitamin (MULTIVITAMIN) tablet 1 tablet, Oral, Daily,     PARoxetine (PAXIL) 10 MG tablet TAKE 1 TABLET BY MOUTH  DAILY   psyllium  (REGULOID) 0.52 g, Oral, Daily   terbinafine (LAMISIL) 250 mg, Oral, Daily       Review of Systems  All other systems reviewed and are negative.     Objective:     BP (!) 142/70 (BP Location: Left Arm, Patient Position: Sitting, Cuff Size: Normal)   Pulse 75   Temp 98.1 F (36.7 C) (Oral)   Ht '5\' 1"'$  (1.549 m)   Wt 127 lb 1.6 oz (57.7 kg)   SpO2 98%   BMI 24.02 kg/m    Physical Exam Vitals reviewed.  Constitutional:      Appearance: Normal appearance. She is well-groomed and normal weight.  Eyes:     Conjunctiva/sclera: Conjunctivae normal.  Neck:     Thyroid: No thyromegaly.  Cardiovascular:     Rate and Rhythm: Normal rate and regular rhythm.     Pulses: Normal pulses.     Heart sounds: S1 normal and S2 normal.  Pulmonary:     Effort: Pulmonary effort is normal.     Breath sounds: Normal breath sounds and air entry.  Abdominal:     General: Bowel sounds are normal.  Musculoskeletal:     Right lower leg: No edema.     Left lower leg: No edema.  Neurological:     Mental Status: She is alert and oriented to person, place, and time. Mental status is at baseline.     Gait: Gait is  intact.  Psychiatric:        Mood and Affect: Mood and affect normal.        Speech: Speech normal.        Behavior: Behavior normal.        Judgment: Judgment normal.      No results found for any visits on 12/23/21.    The ASCVD Risk score (Arnett DK, et al., 2019) failed to calculate for the following reasons:   The 2019 ASCVD risk score is only valid for ages 7 to 92    Assessment & Plan:   Problem List Items Addressed This Visit       Unprioritized   Anxiety - Primary (Chronic)    Patient reports doing well on the paroxetine 10 mg daily, she is the primary caregiver for her husband who has both Parkinson's and Alzheimers' dementia.      Relevant Medications   PARoxetine (PAXIL) 10 MG tablet   Onychomycosis (Chronic)    Will treat with terbinafine 250 mg daily  for 3 months, then new healthy nail should grow out in the next year.       Relevant Medications   terbinafine (LAMISIL) 250 MG tablet   Osteoporosis (Chronic)    Has been on fosamax 70 mg weekly for 2 years, will repeat DEXA scan this year to look for improvement.      Relevant Orders   DG Bone Density   Other Visit Diagnoses     Encounter for screening mammogram for malignant neoplasm of breast       Relevant Orders   MM Digital Screening   Postmenopausal state       Relevant Orders   DG Bone Density   Immunization due       Relevant Orders   Pneumococcal conjugate vaccine 20-valent (Prevnar 20) (Completed)       Return in about 1 year (around 12/24/2022) for Annual check up.    Farrel Conners, MD

## 2021-12-23 NOTE — Patient Instructions (Signed)
You may go to any pharmacy to get a tetanus vaccine.

## 2021-12-24 DIAGNOSIS — M810 Age-related osteoporosis without current pathological fracture: Secondary | ICD-10-CM | POA: Insufficient documentation

## 2021-12-24 DIAGNOSIS — B351 Tinea unguium: Secondary | ICD-10-CM | POA: Insufficient documentation

## 2021-12-24 DIAGNOSIS — F419 Anxiety disorder, unspecified: Secondary | ICD-10-CM | POA: Insufficient documentation

## 2021-12-24 NOTE — Assessment & Plan Note (Signed)
Will treat with terbinafine 250 mg daily for 3 months, then new healthy nail should grow out in the next year.

## 2021-12-24 NOTE — Assessment & Plan Note (Signed)
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

## 2021-12-24 NOTE — Assessment & Plan Note (Signed)
Has been on fosamax 70 mg weekly for 2 years, will repeat DEXA scan this year to look for improvement.

## 2022-01-09 DIAGNOSIS — L03012 Cellulitis of left finger: Secondary | ICD-10-CM | POA: Diagnosis not present

## 2022-01-14 ENCOUNTER — Encounter: Payer: Self-pay | Admitting: Family Medicine

## 2022-01-14 ENCOUNTER — Telehealth: Payer: Self-pay

## 2022-01-14 ENCOUNTER — Ambulatory Visit (INDEPENDENT_AMBULATORY_CARE_PROVIDER_SITE_OTHER): Payer: Medicare Other | Admitting: Family Medicine

## 2022-01-14 VITALS — BP 150/82 | HR 70 | Temp 98.3°F | Wt 126.4 lb

## 2022-01-14 DIAGNOSIS — L03012 Cellulitis of left finger: Secondary | ICD-10-CM | POA: Diagnosis not present

## 2022-01-14 DIAGNOSIS — R030 Elevated blood-pressure reading, without diagnosis of hypertension: Secondary | ICD-10-CM

## 2022-01-14 MED ORDER — SULFAMETHOXAZOLE-TRIMETHOPRIM 800-160 MG PO TABS: 1.0000 | ORAL_TABLET | Freq: Two times a day (BID) | ORAL | 0 refills | Status: AC

## 2022-01-14 NOTE — Progress Notes (Signed)
   Acute Office Visit  Subjective:     Patient ID: Sydney Lucas, female    DOB: November 14, 1940, 81 y.o.   MRN: [id number]  Chief Complaint  Patient presents with   Cyst    Pt reports she had a cyst on her Left middle finger, got ruptured this past Friday or Saturday. Went to urgent on Sunday. Taking antibiotic. On her fourth day.     HPI Patient is in today for acute concern.  Patient endorses left middle finger with cyst times years.  Cyst ruptured last week causing pain and erythema.  Patient seen at [facility] on Sunday given Rx for doxycycline.  Symptoms have continued.  Patient denies fever, chills, nausea, vomiting.  Alternating Tylenol and ibuprofen.  Patient endorses increased stress as she is the caregiver for her husband.  ROS + Left middle finger erythema, pain, ruptured cyst Denies fever, chills, nausea, vomiting, SOB     Objective:    BP (!) 150/86 (BP Location: Left Arm, Cuff Size: Normal)   Pulse 70   Temp 98.3 F (36.8 C) (Oral)   Wt 126 lb 6.4 oz (57.3 kg)   SpO2 96%   BMI 23.88 kg/m    Physical Exam Gen. Pleasant, well developed, well-nourished, in NAD HEENT - [place]/AT, PERRL, EOMI, conjunctive clear, no scleral icterus, no nasal drainage Lungs: no use of accessory muscles Cardiovascular: RRR,  no peripheral edema Musculoskeletal: Decreased ROM of left third digit.  No deformities, moves all four extremities, no cyanosis or clubbing, normal tone Neuro:  A&Ox3, CN II-XII intact, normal gait Skin:  Warm, dry.  Left third digit with erythema from nail bed to PIP.  Hypopigmented papule distal to DIP TTP with peeling skin, no drainage.  No purulent drainage expressed with raking sterile needle over skin and infected area slight fluctuance   No results found for any visits on 01/14/22.      Assessment & Plan:   Problem List Items Addressed This Visit   None Cellulitis of finger of left hand - Plan: sulfamethoxazole-trimethoprim (BACTRIM DS) 800-160 MG  tablet  Paronychia of finger of left hand - Plan: sulfamethoxazole-trimethoprim (BACTRIM DS) 800-160 MG tablet  Elevated blood pressure reading without diagnosis of hypertension  Ruptured cyst of dorsum of left third digit with continued cellulitis despite doxycycline.  Will d/c doxy as failed txt.  Unable to express purulent matter from cyst.  Allergy hx reviewed. Start Bactrim DS BID.  Given precautions.  Bp elevated on recheck x 3.  No h/o HTN/not on meds.  Likely elevated 2/2 increased stress and pain.  Patient advised to monitor BP at home and keep a log to bring with her to clinic.  For elevations consistently greater than 140/90 notify clinic.  Follow-up in 1 month for BP recheck.  30 minutes spent reviewing chart, history, performing exam and procedure as well as formulating plan for patient on date of service. Meds ordered this encounter  Medications   sulfamethoxazole-trimethoprim (BACTRIM DS) 800-160 MG tablet    Sig: Take 1 tablet by mouth 2 (two) times daily for 7 days.    Dispense:  14 tablet    Refill:  0    No follow-ups on file.  Billie Ruddy, MD

## 2022-01-14 NOTE — Telephone Encounter (Signed)
--  Caller states her middle finger on her L hand had a cyst on it and it ruptured and now has gotten infected.  01/14/2022 7:27:13 AM Go to ED Now Lacinda Axon, RN, [facility] - ED  Pt has appt with Dr Volanda Napoleon today

## 2022-01-17 ENCOUNTER — Telehealth: Payer: Self-pay | Admitting: Family Medicine

## 2022-01-17 NOTE — Telephone Encounter (Signed)
Patient wants to let Dr Volanda Napoleon know the antibiotic she was prescribed is giving her side effects-headache, nausea, diarrhea. Pls advise

## 2022-01-17 NOTE — Telephone Encounter (Signed)
Pt saw Dr. Volanda Napoleon on 01/14/22 Pt called to FU on this inquiry. Pt is now asking that either provider call her back.

## 2022-01-18 ENCOUNTER — Encounter: Payer: Self-pay | Admitting: Family Medicine

## 2022-01-18 NOTE — Telephone Encounter (Signed)
Patient sent a mychart message as well. Message sent to both Dr. Volanda Napoleon & PCP as Dr. Volanda Napoleon is out today.

## 2022-01-25 ENCOUNTER — Ambulatory Visit (INDEPENDENT_AMBULATORY_CARE_PROVIDER_SITE_OTHER): Payer: Medicare Other | Admitting: Family Medicine

## 2022-01-25 ENCOUNTER — Encounter: Payer: Self-pay | Admitting: Family Medicine

## 2022-01-25 VITALS — BP 122/82 | HR 72 | Temp 98.2°F | Ht 61.0 in | Wt 125.4 lb

## 2022-01-25 DIAGNOSIS — L03012 Cellulitis of left finger: Secondary | ICD-10-CM

## 2022-01-25 MED ORDER — CLINDAMYCIN HCL 300 MG PO CAPS: 300.0000 mg | ORAL_CAPSULE | Freq: Three times a day (TID) | ORAL | 0 refills | Status: DC

## 2022-01-25 NOTE — Progress Notes (Signed)
   Established Patient Office Visit  Subjective   Patient ID: Sydney Lucas, female    DOB: [birth year]  Age: 81 y.o. MRN: [id number]  Chief Complaint  Patient presents with   Hand Pain    Patient complains of recurrent pain, swelling and redness noted in the left third digit    Patient is here for follow up of the left third finger cellulitis. States that she was unable to tolerate the bactrim, state sit caused a severe headache and nausea with rash and diarrhea. States that she took it for 4 days but couldn't finish it. States she did finish the doxycycline, took 10 days of that. Pt states that she feels like the swelling and infection is worse, but the pain has improved significantly. No fever or chills at home. No numbness or tingling in the finger.    Current Outpatient Medications  Medication Instructions   alendronate (FOSAMAX) 70 MG tablet TAKE 1 TABLET BY MOUTH  WEEKLY WITH 8 OZ OF PLAIN  WATER 30 MINUTES BEFORE  FIRST FOOD, DRINK OR MEDS.  STAY UPRIGHT FOR 30 MINS   Calcium Carbonate-Vit D-Min (CALTRATE 600+D PLUS) 600-400 MG-UNIT per tablet 1 tablet, Oral, Daily,     clindamycin (CLEOCIN) 300 mg, Oral, 3 times daily   influenza vaccine adjuvanted (FLUAD) 0.5 ML injection 0.5 mLs, Intramuscular   Multiple Vitamin (MULTIVITAMIN) tablet 1 tablet, Oral, Daily,     PARoxetine (PAXIL) 10 MG tablet TAKE 1 TABLET BY MOUTH  DAILY   psyllium (REGULOID) 0.52 g, Oral, Daily   terbinafine (LAMISIL) 250 mg, Oral, Daily     Patient Active Problem List   Diagnosis Date Noted   Anxiety 12/24/2021   Onychomycosis 12/24/2021   Osteoporosis 12/24/2021   History of colonic polyps 08/02/2011   Paroxysmal atrial fibrillation ([place]) 04/15/2011      ROS    Objective:     BP 122/82 (BP Location: Left Arm, Patient Position: Sitting, Cuff Size: Normal)   Pulse 72   Temp 98.2 F (36.8 C) (Oral)   Ht '5\' 1"'$  (1.549 m)   Wt 125 lb 6.4 oz (56.9 kg)   SpO2 97%   BMI 23.69 kg/m     Physical Exam Vitals reviewed.  Constitutional:      Appearance: Normal appearance. She is normal weight.  Musculoskeletal:     Left hand: Swelling and tenderness present.  Neurological:     Mental Status: She is alert.      No results found for any visits on 01/25/22.    The ASCVD Risk score (Arnett DK, et al., 2019) failed to calculate for the following reasons:   The 2019 ASCVD risk score is only valid for ages 40 to 23    Assessment & Plan:   Problem List Items Addressed This Visit   None Visit Diagnoses     Cellulitis of finger of left hand    -  Primary   Relevant Medications   clindamycin (CLEOCIN) 300 MG capsule     The finger appears like it is healing, pt reports that the pain is much improved, however it remains erythematous and edematous per the patient's report. I have placed bactrim in her allergy list and will give her an additional course of clindamycin 300 mg TID. I advised that if she develops any diarrhea with the medication to notify me and we many need to change the abx.  No follow-ups on file.    Farrel Conners, MD

## 2022-02-02 ENCOUNTER — Ambulatory Visit
Admission: EM | Admit: 2022-02-02 | Discharge: 2022-02-02 | Disposition: A | Discharge status: Home / Self Care | Payer: Medicare Other | Attending: Urgent Care | Admitting: Urgent Care

## 2022-02-02 ENCOUNTER — Ambulatory Visit (INDEPENDENT_AMBULATORY_CARE_PROVIDER_SITE_OTHER): Payer: Medicare Other

## 2022-02-02 DIAGNOSIS — J209 Acute bronchitis, unspecified: Secondary | ICD-10-CM

## 2022-02-02 DIAGNOSIS — L03012 Cellulitis of left finger: Secondary | ICD-10-CM

## 2022-02-02 DIAGNOSIS — R030 Elevated blood-pressure reading, without diagnosis of hypertension: Secondary | ICD-10-CM | POA: Diagnosis not present

## 2022-02-02 DIAGNOSIS — R609 Edema, unspecified: Secondary | ICD-10-CM | POA: Diagnosis not present

## 2022-02-02 DIAGNOSIS — R600 Localized edema: Secondary | ICD-10-CM | POA: Diagnosis not present

## 2022-02-02 MED ORDER — POTASSIUM CHLORIDE ER 10 MEQ PO TBCR: 20.0000 meq | EXTENDED_RELEASE_TABLET | Freq: Every day | ORAL | 0 refills | Status: DC

## 2022-02-02 MED ORDER — FUROSEMIDE 40 MG PO TABS: 40.0000 mg | ORAL_TABLET | Freq: Every day | ORAL | 0 refills | Status: DC

## 2022-02-02 MED ORDER — DOXYCYCLINE HYCLATE 100 MG PO CAPS: 100.0000 mg | ORAL_CAPSULE | Freq: Two times a day (BID) | ORAL | 0 refills | Status: DC

## 2022-02-02 NOTE — ED Triage Notes (Addendum)
Pt c/o bilat LE swelling x 3-4 days-denies hx of same-NAD-steady gait-pt added that she is taking 3rd round of abx for finger infection/unsure if related to c/o

## 2022-02-02 NOTE — ED Provider Notes (Signed)
[facility]  Note:  This document was prepared using Systems analyst and may include unintentional dictation errors.  MRN: [id number] DOB: 25-Dec-1940  Subjective:   Sydney Lucas is a 81 y.o. female presenting for 3 to 4-day history of acute onset bilateral lower leg swelling worse in the feet and ankles.  No fever, cough, chest pain, shortness of breath or wheezing.  No history of CHF.  Patient has a remote history of paroxysmal atrial fibrillation but has not had to see a cardiologist in years.  She is not on any antiarrhythmic medications.  They do believe that was provoked about 10 years ago.  She does get regular follow-up with her PCP.  No history of hypertension.  She is currently undergoing an antibiotic course with clindamycin for a left third finger infection.  Has had some pain and redness with swelling.  No current facility-administered medications for this encounter.  Current Outpatient Medications:    alendronate (FOSAMAX) 70 MG tablet, TAKE 1 TABLET BY MOUTH  WEEKLY WITH 8 OZ OF PLAIN  WATER 30 MINUTES BEFORE  FIRST FOOD, DRINK OR MEDS.  STAY UPRIGHT FOR 30 MINS, Disp: 12 tablet, Rfl: 3   Calcium Carbonate-Vit D-Min (CALTRATE 600+D PLUS) 600-400 MG-UNIT per tablet, Take 1 tablet by mouth daily., Disp: , Rfl:    clindamycin (CLEOCIN) 300 MG capsule, Take 1 capsule (300 mg total) by mouth 3 (three) times daily., Disp: 30 capsule, Rfl: 0   influenza vaccine adjuvanted (FLUAD) 0.5 ML injection, Inject 0.5 mLs into the muscle., Disp: 0.5 mL, Rfl: 0   Multiple Vitamin (MULTIVITAMIN) tablet, Take 1 tablet by mouth daily., Disp: , Rfl:    PARoxetine (PAXIL) 10 MG tablet, TAKE 1 TABLET BY MOUTH  DAILY, Disp: 90 tablet, Rfl: 3   psyllium (REGULOID) 0.52 g capsule, Take 0.52 g by mouth daily., Disp: , Rfl:    terbinafine (LAMISIL) 250 MG tablet, Take 1 tablet (250 mg total) by mouth daily., Disp: 90 tablet, Rfl: 0   Allergies   Allergen Reactions   Imipramine Nausea Only and Anxiety    Blurred vision, increased heart rate,    Bactrim [Sulfamethoxazole-Trimethoprim] Rash    Rash, headache, nausea and diarrhea   Penicillins     REACTION: rash   Tramadol Anxiety    Past Medical History:  Diagnosis Date   Headache(784.0)      History reviewed. No pertinent surgical history.  Family History  Problem Relation Age of Onset   Diabetes Father    CAD Father     Social History   Tobacco Use   Smoking status: Former    Types: Cigarettes    Quit date: 02/08/1968    Years since quitting: 54.0   Smokeless tobacco: Never  Substance Use Topics   Alcohol use: No   Drug use: No    ROS   Objective:   Vitals: BP (!) 203/87 (BP Location: Right Arm)   Pulse 65   Temp 98.3 F (36.8 C) (Oral)   Resp 15   SpO2 94%   BP Readings from Last 3 Encounters:  02/02/22 (!) 203/87  01/25/22 122/82  01/14/22 (!) 150/82   Physical Exam Constitutional:      General: She is not in acute distress.    Appearance: Normal appearance. She is well-developed. She is not ill-appearing, toxic-appearing or diaphoretic.  HENT:     Head: Normocephalic and atraumatic.     Nose: Nose normal.     Mouth/Throat:  Mouth: Mucous membranes are moist.  Eyes:     General: No scleral icterus.       Right eye: No discharge.        Left eye: No discharge.     Extraocular Movements: Extraocular movements intact.  Cardiovascular:     Rate and Rhythm: Normal rate and regular rhythm.     Heart sounds: Normal heart sounds. No murmur heard.    No friction rub. No gallop.  Pulmonary:     Effort: Pulmonary effort is normal. No respiratory distress.     Breath sounds: No stridor. No wheezing, rhonchi or rales.  Chest:     Chest wall: No tenderness.  Musculoskeletal:       Hands:     Right lower leg: Edema (1+ involving the feet up to the distal lower leg) present.     Left lower leg: Edema (1+ involving the feet up to the distal  lower leg) present.  Skin:    General: Skin is warm and dry.  Neurological:     General: No focal deficit present.     Mental Status: She is alert and oriented to person, place, and time.     Cranial Nerves: No cranial nerve deficit.     Motor: No weakness.     Coordination: Coordination normal.     Gait: Gait normal.     Comments: Negative Romberg and pronator drift, no facial asymmetry.  Psychiatric:        Mood and Affect: Mood normal.        Behavior: Behavior normal.     DG Chest 2 View  Result Date: 02/02/2022 CLINICAL DATA:  Peripheral edema. Bilateral lower extremity swelling for 3-4 days with unsteady gait. Recent antibiotics for finger infection. EXAM: CHEST - 2 VIEW COMPARISON:  Limited correlation made with left shoulder radiographs 12/29/2017 and thoracic spine radiographs 03/09/2017. FINDINGS: The heart size and mediastinal contours are stable with aortic atherosclerosis and mild tortuosity. Coarse interstitial markings in both lungs appear chronic and similar to prior thoracic spine radiographs. There may be central airway thickening. No focal airspace disease, pleural effusion or pneumothorax. Moderate convex right thoracolumbar scoliosis without evidence of acute osseous abnormality. IMPRESSION: 1. Probable chronic interstitial lung disease with possible central airway thickening which could indicate bronchitis. No evidence of pneumonia or edema. Consider radiographic follow-up if the patient remains symptomatic. 2. Aortic atherosclerosis. 3. Thoracolumbar scoliosis. Electronically Signed   By: Richardean Sale M.D.   On: 02/02/2022 11:11     Assessment and Plan :   PDMP not reviewed this encounter.  1. Peripheral edema   2. Elevated blood pressure reading without diagnosis of hypertension   3. Acute bronchitis, unspecified organism   4. Cellulitis of left finger     Case discussed with Dr. Windy Carina.  Will hold off on any steroid treatment for possible bronchitis is  seen through the chest x-ray.  She does not have any respiratory symptoms and therefore do not suspect chf.  Recommended managing with Lasix and potassium supplementation.  I do believe that this is related to the foods that she has been eating over the holidays and patient is in agreement.  No history of hypertension, emphasized the need to monitor her blood pressures and reviewed strict ER precautions.  No signs of an acute encephalopathy.  Labs pending.  I will switch her from clindamycin to doxycycline for cellulitis of the left finger.  Follow-up closely with the PCP. Counseled patient on potential for adverse effects  with medications prescribed/recommended today, ER and return-to-clinic precautions discussed, patient verbalized understanding.    Jaynee Eagles, PA-C 02/02/22 1252

## 2022-02-03 LAB — CBC WITH DIFFERENTIAL/PLATELET
Basophils Absolute: 0.1 10*3/uL (ref 0.0–0.2)
Basos: 1 %
EOS (ABSOLUTE): 0 10*3/uL (ref 0.0–0.4)
Eos: 0 %
Hematocrit: 33.7 % — ABNORMAL LOW (ref 34.0–46.6)
Hemoglobin: 11.4 g/dL (ref 11.1–15.9)
Immature Grans (Abs): 0 10*3/uL (ref 0.0–0.1)
Immature Granulocytes: 0 %
Lymphocytes Absolute: 2.2 10*3/uL (ref 0.7–3.1)
Lymphs: 32 %
MCH: 31.1 pg (ref 26.6–33.0)
MCHC: 33.8 g/dL (ref 31.5–35.7)
MCV: 92 fL (ref 79–97)
Monocytes Absolute: 0.9 10*3/uL (ref 0.1–0.9)
Monocytes: 12 %
Neutrophils Absolute: 3.9 10*3/uL (ref 1.4–7.0)
Neutrophils: 55 %
Platelets: 314 10*3/uL (ref 150–450)
RBC: 3.66 x10E6/uL — ABNORMAL LOW (ref 3.77–5.28)
RDW: 12.2 % (ref 11.7–15.4)
WBC: 7 10*3/uL (ref 3.4–10.8)

## 2022-02-03 LAB — COMPREHENSIVE METABOLIC PANEL
ALT: 18 IU/L (ref 0–32)
AST: 28 IU/L (ref 0–40)
Albumin/Globulin Ratio: 1.4 (ref 1.2–2.2)
Albumin: 3.9 g/dL (ref 3.7–4.7)
Alkaline Phosphatase: 57 IU/L (ref 44–121)
BUN/Creatinine Ratio: 10 — ABNORMAL LOW (ref 12–28)
BUN: 7 mg/dL — ABNORMAL LOW (ref 8–27)
Bilirubin Total: 0.5 mg/dL (ref 0.0–1.2)
CO2: 26 mmol/L (ref 20–29)
Calcium: 9.3 mg/dL (ref 8.7–10.3)
Chloride: 103 mmol/L (ref 96–106)
Creatinine, Ser: 0.69 mg/dL (ref 0.57–1.00)
Globulin, Total: 2.8 g/dL (ref 1.5–4.5)
Glucose: 89 mg/dL (ref 70–99)
Potassium: 3.7 mmol/L (ref 3.5–5.2)
Sodium: 141 mmol/L (ref 134–144)
Total Protein: 6.7 g/dL (ref 6.0–8.5)
eGFR: 86 mL/min/{1.73_m2} (ref >59)

## 2022-02-08 ENCOUNTER — Ambulatory Visit (INDEPENDENT_AMBULATORY_CARE_PROVIDER_SITE_OTHER): Payer: Medicare Other

## 2022-02-08 ENCOUNTER — Encounter: Payer: Self-pay | Admitting: Family Medicine

## 2022-02-08 ENCOUNTER — Ambulatory Visit (INDEPENDENT_AMBULATORY_CARE_PROVIDER_SITE_OTHER): Payer: Medicare Other | Admitting: Family Medicine

## 2022-02-08 VITALS — BP 180/90 | HR 72 | Temp 98.3°F | Ht 61.0 in | Wt 121.9 lb

## 2022-02-08 DIAGNOSIS — L03012 Cellulitis of left finger: Secondary | ICD-10-CM | POA: Diagnosis not present

## 2022-02-08 DIAGNOSIS — I10 Essential (primary) hypertension: Secondary | ICD-10-CM

## 2022-02-08 DIAGNOSIS — M7989 Other specified soft tissue disorders: Secondary | ICD-10-CM | POA: Diagnosis not present

## 2022-02-08 LAB — C-REACTIVE PROTEIN: CRP: <1 mg/dL (ref 0.5–20.0)

## 2022-02-08 MED ORDER — CLONIDINE HCL 0.1 MG PO TABS: ORAL_TABLET | ORAL | 0 refills | Status: DC

## 2022-02-08 MED ORDER — TELMISARTAN 20 MG PO TABS: 20.0000 mg | ORAL_TABLET | Freq: Every day | ORAL | 0 refills | Status: DC

## 2022-02-08 NOTE — Progress Notes (Signed)
Established Patient Office Visit  Subjective   Patient ID: Sydney Lucas, female    DOB: 1940-08-11  Age: 82 y.o. MRN: [id number]  Chief Complaint  Patient presents with   Edema    Patient complains of increased swelling in the left 3rd digit x1 day    Patient is here for continued redness and swelling of the third digit on the left hand. She has finished the clindamycin and there is no drainage, the skin appears healed however the redness and tenderness has remained. It remains swollen as well. States that she did not feel much of a difference when on the clindamycin, states that she finished the antibiotic 2 days ago and since then she felt the pain starting to return.   Patient reports she went to urgent care on 12/27 and her BP was very elevated, over 967 systolic. I reviewed the BP readings and her bloodwork from that visit. She states they checked it multiple times while she was there and they got the same readings. BP today in office was initially Wnl, however I rechecked it and it got 180/90. Patient has had elevated BP at previous visits, although it has never been this elevated. We discussed next steps and I recommended we start BP medication and she is to check her BP daily.    Current Outpatient Medications  Medication Instructions   alendronate (FOSAMAX) 70 MG tablet TAKE 1 TABLET BY MOUTH  WEEKLY WITH 8 OZ OF PLAIN  WATER 30 MINUTES BEFORE  FIRST FOOD, DRINK OR MEDS.  STAY UPRIGHT FOR 30 MINS   Calcium Carbonate-Vit D-Min (CALTRATE 600+D PLUS) 600-400 MG-UNIT per tablet 1 tablet, Oral, Daily,     cloNIDine (CATAPRES) 0.1 MG tablet Take 1 tablet every 8 hours as needed for systolic BP greater than 591.   influenza vaccine adjuvanted (FLUAD) 0.5 ML injection 0.5 mLs, Intramuscular   Multiple Vitamin (MULTIVITAMIN) tablet 1 tablet, Oral, Daily,     PARoxetine (PAXIL) 10 MG tablet TAKE 1 TABLET BY MOUTH  DAILY   psyllium (REGULOID) 0.52 g, Oral, Daily   telmisartan  (MICARDIS) 20 mg, Oral, Daily at bedtime   terbinafine (LAMISIL) 250 mg, Oral, Daily       Review of Systems  All other systems reviewed and are negative.     Objective:     BP (!) 180/90 (BP Location: Right Arm, Patient Position: Sitting, Cuff Size: Normal)   Pulse 72   Temp 98.3 F (36.8 C) (Oral)   Ht '5\' 1"'$  (1.549 m)   Wt 121 lb 14.4 oz (55.3 kg)   SpO2 97%   BMI 23.03 kg/m    Physical Exam Vitals reviewed.  Constitutional:      Appearance: Normal appearance. She is normal weight.  Musculoskeletal:     Left hand: Swelling (3rd digit at the distal phalanx) and tenderness (3rd digit on the distal phalanx around the nail bed) present.  Neurological:     Mental Status: She is alert.      No results found for any visits on 02/08/22.    The ASCVD Risk score (Arnett DK, et al., 2019) failed to calculate for the following reasons:   The 2019 ASCVD risk score is only valid for ages 9 to 33    Assessment & Plan:   Problem List Items Addressed This Visit       Unprioritized   HTN (hypertension)    New diagnosis, multiple elevated BP readings over the last few days documented. Will start  telmisartan 20 mg daily at bedtime and I also gave her clonidine 0.1 mg tablet to take as needed for systolic BP greater than 194 at home. I will see her back in 8 weeks to re-evaluate her BP.      Relevant Medications   telmisartan (MICARDIS) 20 MG tablet   cloNIDine (CATAPRES) 0.1 MG tablet   Other Visit Diagnoses     Cellulitis of finger of left hand    -  Primary   Relevant Orders   Patient's finger is no longer draining fluid, however it remains erythematous and swollen in the distal phalanx. I will order a CRP and x-rays to look for underlying bony involvement. If these tests are negative then I will treat with a course of third generation cephalosporin. Will wait for the tests I ordered before deciding on next steps.  C-reactive Protein   DG Hand Complete Left        Return in about 8 weeks (around 04/05/2022) for HTN recheck.    Farrel Conners, MD

## 2022-02-08 NOTE — Assessment & Plan Note (Signed)
New diagnosis, multiple elevated BP readings over the last few days documented. Will start telmisartan 20 mg daily at bedtime and I also gave her clonidine 0.1 mg tablet to take as needed for systolic BP greater than 471 at home. I will see her back in 8 weeks to re-evaluate her BP.

## 2022-02-09 MED ORDER — CEFPODOXIME PROXETIL 200 MG PO TABS: 200.0000 mg | ORAL_TABLET | Freq: Two times a day (BID) | ORAL | 0 refills | Status: DC

## 2022-02-17 ENCOUNTER — Encounter: Payer: Self-pay | Admitting: Family Medicine

## 2022-02-23 ENCOUNTER — Encounter: Payer: Self-pay | Admitting: Family Medicine

## 2022-02-23 DIAGNOSIS — L03012 Cellulitis of left finger: Secondary | ICD-10-CM

## 2022-02-23 MED ORDER — CEFPODOXIME PROXETIL 200 MG PO TABS: 200.0000 mg | ORAL_TABLET | Freq: Two times a day (BID) | ORAL | 0 refills | Status: AC

## 2022-03-04 ENCOUNTER — Encounter: Payer: Self-pay | Admitting: Family Medicine

## 2022-03-04 DIAGNOSIS — L03012 Cellulitis of left finger: Secondary | ICD-10-CM

## 2022-03-07 NOTE — Telephone Encounter (Signed)
I don't think the ED is appropriate, this has been going on for a very long time, she has had multiple rounds of antibiotics- is there someone else we can send her to?

## 2022-03-07 NOTE — Telephone Encounter (Signed)
Rhonda at the hand specialist's office called to ask that MD give Pt a new referral to another location because they have no availability.  Suanne Marker suggested maybe Pt go to ED.

## 2022-03-15 DIAGNOSIS — Z1231 Encounter for screening mammogram for malignant neoplasm of breast: Secondary | ICD-10-CM | POA: Diagnosis not present

## 2022-03-15 DIAGNOSIS — M8589 Other specified disorders of bone density and structure, multiple sites: Secondary | ICD-10-CM | POA: Diagnosis not present

## 2022-03-15 DIAGNOSIS — M810 Age-related osteoporosis without current pathological fracture: Secondary | ICD-10-CM | POA: Diagnosis not present

## 2022-03-15 DIAGNOSIS — Z780 Asymptomatic menopausal state: Secondary | ICD-10-CM | POA: Diagnosis not present

## 2022-03-15 LAB — HM MAMMOGRAPHY

## 2022-03-15 LAB — HM DEXA SCAN

## 2022-03-25 ENCOUNTER — Encounter: Payer: Self-pay | Admitting: Family Medicine

## 2022-03-28 NOTE — Telephone Encounter (Signed)
I called [facility] at [phone] and Loree Fee stated the bone density results will be sent via fax within 24-48 hours.  Message sent to PCP.

## 2022-04-30 ENCOUNTER — Other Ambulatory Visit: Payer: Self-pay | Admitting: Family Medicine

## 2022-04-30 DIAGNOSIS — I10 Essential (primary) hypertension: Secondary | ICD-10-CM

## 2022-06-07 ENCOUNTER — Other Ambulatory Visit: Payer: Self-pay | Admitting: Family

## 2022-06-11 ENCOUNTER — Encounter: Payer: Self-pay | Admitting: Family Medicine

## 2022-06-13 MED ORDER — ALENDRONATE SODIUM 70 MG PO TABS: ORAL_TABLET | ORAL | 3 refills | Status: DC

## 2022-06-13 NOTE — Telephone Encounter (Signed)
Ok to refill 

## 2022-06-28 ENCOUNTER — Telehealth: Payer: Self-pay | Admitting: Family Medicine

## 2022-06-28 DIAGNOSIS — L03012 Cellulitis of left finger: Secondary | ICD-10-CM

## 2022-06-28 NOTE — Telephone Encounter (Signed)
Cat from [facility] called to speak to the referral coordinator regarding Sydney Lucas. Zeb Comfort.  Referral Coordinator was unavailable.  Cat states they cannot see this Pt, because they do not do anything on the hands.

## 2022-06-29 NOTE — Telephone Encounter (Signed)
Would you be able to place the referra to Hand surgery? Thanks!

## 2022-06-29 NOTE — Telephone Encounter (Signed)
Referral placed as below.  

## 2022-07-01 ENCOUNTER — Encounter: Payer: Self-pay | Admitting: Family Medicine

## 2022-07-12 ENCOUNTER — Ambulatory Visit (INDEPENDENT_AMBULATORY_CARE_PROVIDER_SITE_OTHER): Payer: Medicare Other | Admitting: Family Medicine

## 2022-07-12 ENCOUNTER — Encounter: Payer: Self-pay | Admitting: Family Medicine

## 2022-07-12 VITALS — BP 144/90 | HR 65 | Temp 98.5°F | Ht 61.0 in | Wt 130.1 lb

## 2022-07-12 DIAGNOSIS — R002 Palpitations: Secondary | ICD-10-CM | POA: Diagnosis not present

## 2022-07-12 DIAGNOSIS — I10 Essential (primary) hypertension: Secondary | ICD-10-CM

## 2022-07-12 MED ORDER — TELMISARTAN 40 MG PO TABS
40.0000 mg | ORAL_TABLET | Freq: Every day | ORAL | 1 refills | Status: DC
Start: 2022-07-12 — End: 2023-01-23

## 2022-07-12 NOTE — Assessment & Plan Note (Signed)
BP remains elevated today. I advised we increase her telmisartan to 40 mg daily and she is agreeable to the plan. RTC in 3 months for BP recheck.

## 2022-07-12 NOTE — Progress Notes (Signed)
Acute Office Visit  Subjective:     Patient ID: Sydney Lucas, female    DOB: 15-Apr-1940, 82 y.o.   MRN: [id number]  Chief Complaint  Patient presents with   Tachycardia    X3 weeks ago, patient states she felt this was due to panic attacks, did not seek treatment    HPI Patient is in today for an episode of high heart rate, it happened about 3 weeks ago. States she was getting ready for church. States that there was no anxiety, states that it made her feel bad and so she laid down and it went away about 45 minutes later.  Patient states that she has had them more often in the past when she was under a lot of stress.  HTN-- pt reports no symptoms, see ROS below, she reports compliance with her medications, pt reports she was checking her BP at home and it was "doing good". Today's BP is elevated, I rechecked it and it was 160/90. We discussed increasing her medications and she is agreeable.   Review of Systems  Constitutional:  Negative for chills, diaphoresis and fever.  Eyes:  Negative for blurred vision.  Respiratory:  Negative for shortness of breath.   Cardiovascular:  Negative for chest pain and leg swelling.  Gastrointestinal:  Negative for nausea.   Current Outpatient Medications  Medication Instructions   alendronate (FOSAMAX) 70 MG tablet TAKE 1 TABLET BY MOUTH  WEEKLY WITH 8 OZ OF PLAIN  WATER 30 MINUTES BEFORE  FIRST FOOD, DRINK OR MEDS.  STAY UPRIGHT FOR 30 MINS   Calcium Carbonate-Vit D-Min (CALTRATE 600+D PLUS) 600-400 MG-UNIT per tablet 1 tablet, Oral, Daily,     cloNIDine (CATAPRES) 0.1 MG tablet Take 1 tablet every 8 hours as needed for systolic BP greater than 180.   influenza vaccine adjuvanted (FLUAD) 0.5 ML injection 0.5 mLs, Intramuscular   Multiple Vitamin (MULTIVITAMIN) tablet 1 tablet, Oral, Daily,     PARoxetine (PAXIL) 10 MG tablet TAKE 1 TABLET BY MOUTH  DAILY   psyllium (REGULOID) 0.52 g, Oral, Daily   telmisartan (MICARDIS) 40 mg, Oral,  Daily   terbinafine (LAMISIL) 250 mg, Oral, Daily        Objective:    BP (!) 144/90 (BP Location: Right Arm, Patient Position: Sitting, Cuff Size: Normal)   Pulse 65   Temp 98.5 F (36.9 C) (Oral)   Ht 5\' 1"  (1.549 m)   Wt 130 lb 1.6 oz (59 kg)   SpO2 95%   BMI 24.58 kg/m    Physical Exam Vitals reviewed.  Constitutional:      Appearance: Normal appearance. She is well-groomed and normal weight.  Neck:     Thyroid: No thyromegaly.  Cardiovascular:     Rate and Rhythm: Normal rate and regular rhythm.     Heart sounds: S1 normal and S2 normal.  Pulmonary:     Effort: Pulmonary effort is normal.     Breath sounds: Normal breath sounds and air entry.  Musculoskeletal:     Right lower leg: No edema.     Left lower leg: No edema.  Neurological:     Mental Status: She is alert and oriented to person, place, and time. Mental status is at baseline.     Gait: Gait is intact.  Psychiatric:        Mood and Affect: Mood and affect normal.        Speech: Speech normal.        Behavior: Behavior  normal.        Judgment: Judgment normal.   EKG interpretation note:  EKG from 2019 was used in comparison. HR today is 65, Normal sinus rhythm noted, normal P waves and QRS complex. No QT prolongation, no ST or T wave abnormalities, unchanged from previous ECG.  No results found for any visits on 07/12/22.      Assessment & Plan:   Problem List Items Addressed This Visit       Unprioritized   HTN (hypertension)    BP remains elevated today. I advised we increase her telmisartan to 40 mg daily and she is agreeable to the plan. RTC in 3 months for BP recheck.      Relevant Medications   telmisartan (MICARDIS) 40 MG tablet   Other Visit Diagnoses     Heart palpitations    -  Primary   Relevant Orders   EKG 12-Lead (Completed)   TSH   CBC (no diff)   Hemoglobin A1c     Patient has a history of paroxysmal atrial fibrillation that was first noted in 2013, we discussed  referring her to cardiology for further work up, cannot order an event monitor because the patient is allergic to the adhesive in the ECG patches. HR today is sinus rhythm. Pt wants to wait and see if she has more episodes. Will check TSH and CBC as well as A1C to rule out other causes. Pt is to notify me if she has any further episodes.   Meds ordered this encounter  Medications   telmisartan (MICARDIS) 40 MG tablet    Sig: Take 1 tablet (40 mg total) by mouth daily.    Dispense:  90 tablet    Refill:  1    Increase in dosage , BP not well controlled.  I spent 30 minutes with the patient today discussing the possible causes of her heart palpitations, ordering testing, and also discussing her blood pressure management.   Return in about 3 months (around 10/12/2022) for HTN.  Karie Georges, MD

## 2022-07-13 ENCOUNTER — Other Ambulatory Visit (INDEPENDENT_AMBULATORY_CARE_PROVIDER_SITE_OTHER): Payer: Medicare Other

## 2022-07-13 DIAGNOSIS — R002 Palpitations: Secondary | ICD-10-CM | POA: Diagnosis not present

## 2022-07-13 LAB — CBC
HCT: 35.4 % — ABNORMAL LOW (ref 36.0–46.0)
Hemoglobin: 11.9 g/dL — ABNORMAL LOW (ref 12.0–15.0)
MCHC: 33.5 g/dL (ref 30.0–36.0)
MCV: 94.6 fl (ref 78.0–100.0)
Platelets: 287 10*3/uL (ref 150.0–400.0)
RBC: 3.73 Mil/uL — ABNORMAL LOW (ref 3.87–5.11)
RDW: 13.3 % (ref 11.5–15.5)
WBC: 8.3 10*3/uL (ref 4.0–10.5)

## 2022-07-13 LAB — TSH: TSH: 1.31 u[IU]/mL (ref 0.35–5.50)

## 2022-07-13 LAB — HEMOGLOBIN A1C: Hgb A1c MFr Bld: 6.1 % (ref 4.6–6.5)

## 2022-08-30 ENCOUNTER — Encounter: Payer: Self-pay | Admitting: Family Medicine

## 2022-09-07 ENCOUNTER — Encounter (INDEPENDENT_AMBULATORY_CARE_PROVIDER_SITE_OTHER): Payer: Self-pay

## 2022-10-03 ENCOUNTER — Ambulatory Visit (INDEPENDENT_AMBULATORY_CARE_PROVIDER_SITE_OTHER): Payer: Medicare Other

## 2022-10-03 VITALS — Ht 61.0 in | Wt 130.0 lb

## 2022-10-03 DIAGNOSIS — Z0000 Encounter for general adult medical examination without abnormal findings: Secondary | ICD-10-CM | POA: Diagnosis not present

## 2022-10-03 NOTE — Patient Instructions (Addendum)
Ms. Burchell , Thank you for taking time to come for your Medicare Wellness Visit. I appreciate your ongoing commitment to your health goals. Please review the following plan we discussed and let me know if I can assist you in the future.   Referrals/Orders/Follow-Ups/Clinician Recommendations:   This is a list of the screening recommended for you and due dates:  Health Maintenance  Topic Date Due   Zoster (Shingles) Vaccine (1 of 2) Never done   DTaP/Tdap/Td vaccine (2 - Tdap) 02/07/2006   COVID-19 Vaccine (5 - 2023-24 season) 02/08/2022   Flu Shot  09/08/2022   Mammogram  03/16/2023   Medicare Annual Wellness Visit  10/03/2023   Pneumonia Vaccine  Completed   DEXA scan (bone density measurement)  Completed   HPV Vaccine  Aged Out    Advanced directives: (Copy Requested) Please bring a copy of your health care power of attorney and living will to the office to be added to your chart at your convenience.  Next Medicare Annual Wellness Visit scheduled for next year: Yes

## 2022-10-03 NOTE — Progress Notes (Signed)
Subjective:   Sydney Lucas is a 82 y.o. female who presents for Medicare Annual (Subsequent) preventive examination.  Visit Complete: Virtual  I connected with  Sydney Lucas on 10/03/22 by a audio enabled telemedicine application and verified that I am speaking with the correct person using two identifiers.  Patient Location: Home  Provider Location: Home Office  I discussed the limitations of evaluation and management by telemedicine. The patient expressed understanding and agreed to proceed.  Patient Medicare AWV questionnaire was completed by the patient on 10/02/22; I have confirmed that all information answered by patient is correct and no changes since this date.  Review of Systems    Vital Signs: Unable to obtain new vitals due to this being a telehealth visit.  Cardiac Risk Factors include: advanced age (>103men, >42 women);hypertension     Objective:    Today's Vitals   10/03/22 1314  Weight: 130 lb (59 kg)  Height: 5\' 1"  (1.549 m)   Body mass index is 24.56 kg/m.     10/03/2022    1:24 PM 09/29/2021    9:35 AM 07/28/2020    1:10 PM 09/01/2016   11:05 AM  Advanced Directives  Does Patient Have a Medical Advance Directive? Yes Yes Yes Yes  Type of Estate agent of [place];Living will Healthcare Power of [place];Living will Healthcare Power of [place];Living will   Does patient want to make changes to medical advance directive?  No - Patient declined  No - Patient declined  Copy of Healthcare Power of Attorney in Chart? No - copy requested No - copy requested No - copy requested     Current Medications (verified) Outpatient Encounter Medications as of 10/03/2022  Medication Sig   alendronate (FOSAMAX) 70 MG tablet TAKE 1 TABLET BY MOUTH  WEEKLY WITH 8 OZ OF PLAIN  WATER 30 MINUTES BEFORE  FIRST FOOD, DRINK OR MEDS.  STAY UPRIGHT FOR 30 MINS   Calcium Carbonate-Vit D-Min (CALTRATE 600+D PLUS) 600-400 MG-UNIT per tablet  Take 1 tablet by mouth daily.   cloNIDine (CATAPRES) 0.1 MG tablet Take 1 tablet every 8 hours as needed for systolic BP greater than 180.   influenza vaccine adjuvanted (FLUAD) 0.5 ML injection Inject 0.5 mLs into the muscle.   Multiple Vitamin (MULTIVITAMIN) tablet Take 1 tablet by mouth daily.   PARoxetine (PAXIL) 10 MG tablet TAKE 1 TABLET BY MOUTH  DAILY   psyllium (REGULOID) 0.52 g capsule Take 0.52 g by mouth daily.   telmisartan (MICARDIS) 40 MG tablet Take 1 tablet (40 mg total) by mouth daily.   terbinafine (LAMISIL) 250 MG tablet Take 1 tablet (250 mg total) by mouth daily.   No facility-administered encounter medications on file as of 10/03/2022.    Allergies (verified) Imipramine, Bactrim [sulfamethoxazole-trimethoprim], Penicillins, and Tramadol   History: Past Medical History:  Diagnosis Date   Headache(784.0)    History reviewed. No pertinent surgical history. Family History  Problem Relation Age of Onset   Diabetes Father    CAD Father    Social History   Socioeconomic History   Marital status: Widowed    Spouse name: Not on file   Number of children: Not on file   Years of education: Not on file   Highest education level: Master's degree (e.g., MA, MS, MEng, MEd, MSW, MBA)  Occupational History   Not on file  Tobacco Use   Smoking status: Former    Current packs/day: 0.00    Types: Cigarettes  Quit date: 02/08/1968    Years since quitting: 54.6   Smokeless tobacco: Never  Substance and Sexual Activity   Alcohol use: No   Drug use: No   Sexual activity: Not on file  Other Topics Concern   Not on file  Social History Narrative   Not on file   Social Determinants of Health   Financial Resource Strain: Low Risk  (10/02/2022)   Overall Financial Resource Strain (CARDIA)    Difficulty of Paying Living Expenses: Not hard at all  Food Insecurity: No Food Insecurity (10/02/2022)   Hunger Vital Sign    Worried About Running Out of Food in the Last Year:  Never true    Ran Out of Food in the Last Year: Never true  Transportation Needs: No Transportation Needs (10/02/2022)   PRAPARE - Administrator, Civil Service (Medical): No    Lack of Transportation (Non-Medical): No  Physical Activity: Sufficiently Active (10/02/2022)   Exercise Vital Sign    Days of Exercise per Week: 6 days    Minutes of Exercise per Session: 50 min  Stress: No Stress Concern Present (10/02/2022)   [employer] of Occupational Health - Occupational Stress Questionnaire    Feeling of Stress : Not at all  Social Connections: Moderately Integrated (10/02/2022)   Social Connection and Isolation Panel [NHANES]    Frequency of Communication with Friends and Family: More than three times a week    Frequency of Social Gatherings with Friends and Family: Once a week    Attends Religious Services: More than 4 times per year    Active Member of [employer] or Organizations: Yes    Attends Banker Meetings: More than 4 times per year    Marital Status: Widowed    Tobacco Counseling Counseling given: Not Answered   Clinical Intake:  Pre-visit preparation completed: Yes  Pain : No/denies pain     BMI - recorded: 24.56 Nutritional Status: BMI of 19-24  Normal Nutritional Risks: None Diabetes: No  How often do you need to have someone help you when you read instructions, pamphlets, or other written materials from your doctor or pharmacy?: 1 - Never  Interpreter Needed?: No  Information entered by :: Theresa Mulligan LPN   Activities of Daily Living    10/02/2022    2:14 PM  In your present state of health, do you have any difficulty performing the following activities:  Hearing? 0  Vision? 0  Difficulty concentrating or making decisions? 0  Walking or climbing stairs? 0  Dressing or bathing? 0  Doing errands, shopping? 0  Preparing Food and eating ? N  Using the Toilet? N  In the past six months, have you accidently leaked urine? Y   Comment Wears pads. Followed by Urologist  Do you have problems with loss of bowel control? N  Managing your Medications? N  Managing your Finances? N  Housekeeping or managing your Housekeeping? N    Patient Care Team: Karie Georges, MD as PCP - General (Family Medicine)  Indicate any recent Medical Services you may have received from other than [facility] providers in the past year (date may be approximate).     Assessment:   This is a routine wellness examination for Charlice.  Hearing/Vision screen Hearing Screening - Comments:: Denies hearing difficulties   Vision Screening - Comments:: Wears rx glasses - up to date with routine eye exams with  [facility]  Dietary issues and exercise activities discussed:  Goals Addressed               This Visit's Progress     Lose Weight (pt-stated)        Get better posture.       Depression Screen    10/03/2022    1:21 PM 01/14/2022    2:10 PM 12/23/2021    2:27 PM 09/29/2021    9:31 AM 03/12/2021    2:35 PM 07/28/2020    1:11 PM 07/28/2020    1:08 PM  PHQ 2/9 Scores  PHQ - 2 Score 0 0 0 0 0 0 0  PHQ- 9 Score  1 0  1      Fall Risk    10/02/2022    2:14 PM 07/08/2022   11:52 AM 01/14/2022    2:10 PM 12/23/2021    2:28 PM 09/29/2021    9:33 AM  Fall Risk   Falls in the past year? 1 1 0 0 0  Number falls in past yr: 0 0 0 0 0  Injury with Fall? 0 0 0 0 0  Risk for fall due to :   No Fall Risks Other (Comment) No Fall Risks  Follow up   Falls evaluation completed Falls evaluation completed     MEDICARE RISK AT HOME: Medicare Risk at Home Any stairs in or around the home?: No If so, are there any without handrails?: No Home free of loose throw rugs in walkways, pet beds, electrical cords, etc?: Yes Adequate lighting in your home to reduce risk of falls?: Yes Life alert?: No Use of a cane, walker or w/c?: No Grab bars in the bathroom?: No Shower chair or bench in shower?: Yes Elevated toilet seat or a  handicapped toilet?: No  TIMED UP AND GO:  Was the test performed?  No    Cognitive Function:        10/03/2022    1:24 PM 09/29/2021    9:35 AM  6CIT Screen  What Year? 0 points 0 points  What month? 0 points 0 points  What time? 0 points 0 points  Count back from 20 0 points 0 points  Months in reverse 0 points 0 points  Repeat phrase 0 points 0 points  Total Score 0 points 0 points    Immunizations Immunization History  Administered Date(s) Administered   Fluad Quad(high Dose 65+) 11/01/2018, 12/08/2020, 12/07/2021   Influenza, High Dose Seasonal PF 11/24/2016, 11/09/2017   Influenza-Unspecified 01/22/2015, 01/08/2016, 11/08/2019   PFIZER(Purple Top)SARS-COV-2 Vaccination 02/20/2019, 03/12/2019, 10/09/2019   PNEUMOCOCCAL CONJUGATE-20 12/23/2021   Pfizer Covid-19 Vaccine Bivalent Booster 73yrs & up 12/14/2021   Pneumococcal Conjugate-13 11/01/2018   Td 02/08/1996    TDAP status: Due, Education has been provided regarding the importance of this vaccine. Advised may receive this vaccine at local pharmacy or Health Dept. Aware to provide a copy of the vaccination record if obtained from local pharmacy or Health Dept. Verbalized acceptance and understanding.  Flu Vaccine status: Due, Education has been provided regarding the importance of this vaccine. Advised may receive this vaccine at local pharmacy or Health Dept. Aware to provide a copy of the vaccination record if obtained from local pharmacy or Health Dept. Verbalized acceptance and understanding.  Pneumococcal vaccine status: Up to date  Covid-19 vaccine status: Declined, Education has been provided regarding the importance of this vaccine but patient still declined. Advised may receive this vaccine at local pharmacy or Health Dept.or vaccine clinic. Aware to provide a copy of  the vaccination record if obtained from local pharmacy or Health Dept. Verbalized acceptance and understanding.  Qualifies for Shingles Vaccine?  Yes   Zostavax completed No   Shingrix Completed?: No.    Education has been provided regarding the importance of this vaccine. Patient has been advised to call insurance company to determine out of pocket expense if they have not yet received this vaccine. Advised may also receive vaccine at local pharmacy or Health Dept. Verbalized acceptance and understanding.  Screening Tests Health Maintenance  Topic Date Due   Zoster Vaccines- Shingrix (1 of 2) Never done   DTaP/Tdap/Td (2 - Tdap) 02/07/2006   COVID-19 Vaccine (5 - 2023-24 season) 02/08/2022   INFLUENZA VACCINE  09/08/2022   MAMMOGRAM  03/16/2023   Medicare Annual Wellness (AWV)  10/03/2023   Pneumonia Vaccine 40+ Years old  Completed   DEXA SCAN  Completed   HPV VACCINES  Aged Out    Health Maintenance  Health Maintenance Due  Topic Date Due   Zoster Vaccines- Shingrix (1 of 2) Never done   DTaP/Tdap/Td (2 - Tdap) 02/07/2006   COVID-19 Vaccine (5 - 2023-24 season) 02/08/2022   INFLUENZA VACCINE  09/08/2022        Bone Density status: Completed 03/15/22. Results reflect: Bone density results: OSTEOPOROSIS. Repeat every   years.  Lung Cancer Screening: (Low Dose CT Chest recommended if Age 70-80 years, 20 pack-year currently smoking OR have quit w/in 15years.) does not qualify.     Additional Screening:  Hepatitis C Screening: does not qualify;   Vision Screening: Recommended annual ophthalmology exams for early detection of glaucoma and other disorders of the eye. Is the patient up to date with their annual eye exam?  Yes  Who is the provider or what is the name of the office in which the patient attends annual eye exams? [facility] If pt is not established with a provider, would they like to be referred to a provider to establish care? No .   Dental Screening: Recommended annual dental exams for proper oral hygiene    Community Resource Referral / Chronic Care Management:  CRR required this visit?  No    CCM required this visit?  No     Plan:     I have personally reviewed and noted the following in the patient's chart:   Medical and social history Use of alcohol, tobacco or illicit drugs  Current medications and supplements including opioid prescriptions. Patient is not currently taking opioid prescriptions. Functional ability and status Nutritional status Physical activity Advanced directives List of other physicians Hospitalizations, surgeries, and ER visits in previous 12 months Vitals Screenings to include cognitive, depression, and falls Referrals and appointments  In addition, I have reviewed and discussed with patient certain preventive protocols, quality metrics, and best practice recommendations. A written personalized care plan for preventive services as well as general preventive health recommendations were provided to patient.     Tillie Rung, LPN   [id number]   After Visit Summary: (MyChart) Due to this being a telephonic visit, the after visit summary with patients personalized plan was offered to patient via MyChart   Nurse Notes: None

## 2022-10-06 ENCOUNTER — Encounter: Payer: Self-pay | Admitting: Family Medicine

## 2022-10-17 ENCOUNTER — Encounter: Payer: Self-pay | Admitting: Family Medicine

## 2022-10-17 DIAGNOSIS — R002 Palpitations: Secondary | ICD-10-CM

## 2022-10-20 ENCOUNTER — Encounter: Payer: Self-pay | Admitting: Family Medicine

## 2022-11-01 DIAGNOSIS — H35033 Hypertensive retinopathy, bilateral: Secondary | ICD-10-CM | POA: Diagnosis not present

## 2022-11-01 DIAGNOSIS — H25813 Combined forms of age-related cataract, bilateral: Secondary | ICD-10-CM | POA: Diagnosis not present

## 2022-11-01 DIAGNOSIS — H3554 Dystrophies primarily involving the retinal pigment epithelium: Secondary | ICD-10-CM | POA: Diagnosis not present

## 2022-11-08 ENCOUNTER — Encounter: Payer: Self-pay | Admitting: Family Medicine

## 2022-11-11 DIAGNOSIS — H25813 Combined forms of age-related cataract, bilateral: Secondary | ICD-10-CM | POA: Diagnosis not present

## 2022-11-11 DIAGNOSIS — H25812 Combined forms of age-related cataract, left eye: Secondary | ICD-10-CM | POA: Diagnosis not present

## 2022-11-11 DIAGNOSIS — H18462 Peripheral corneal degeneration, left eye: Secondary | ICD-10-CM | POA: Diagnosis not present

## 2022-11-11 DIAGNOSIS — H52203 Unspecified astigmatism, bilateral: Secondary | ICD-10-CM | POA: Diagnosis not present

## 2022-12-29 ENCOUNTER — Encounter: Payer: Self-pay | Admitting: Family Medicine

## 2023-01-02 ENCOUNTER — Ambulatory Visit: Payer: Medicare Other | Attending: Cardiology | Admitting: Cardiology

## 2023-01-02 ENCOUNTER — Encounter: Payer: Self-pay | Admitting: Cardiology

## 2023-01-02 VITALS — BP 110/64 | HR 77 | Ht 62.0 in | Wt 128.0 lb

## 2023-01-02 DIAGNOSIS — I051 Rheumatic mitral insufficiency: Secondary | ICD-10-CM

## 2023-01-02 DIAGNOSIS — I10 Essential (primary) hypertension: Secondary | ICD-10-CM | POA: Diagnosis not present

## 2023-01-02 NOTE — Progress Notes (Signed)
Cardiology Office Note:  .   Date:  01/02/2023  ID:  Sydney Lucas, DOB 10-07-1940, MRN [id number] PCP: Karie Georges, MD  [facility] Providers Cardiologist:  None     History of Present Illness: .   Sydney Lucas is a 82 y.o. female Discussed with the use of AI scribe   History of Present Illness   An 82 year old patient with a history of hypertension, managed with Micardis 40mg  daily, presents for evaluation of palpitations. The patient reports episodes of rapid heart rate, the most recent of which occurred in June 2024. During this episode, the patient felt unwell and had to lie down for approximately 45 minutes until the symptoms subsided. The patient has noticed these episodes in the past, often associated with periods of stress.  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 These attacks were often accompanied by a racing heart and were debilitating enough to require the patient to lie down until she passed. The patient describes the episodes as an abrupt onset of a racing heart that would eventually stop just as abruptly.  The patient has been experiencing these episodes for several years. A previous heart monitor worn by the patient did not capture any abnormalities. An echocardiogram from 2013 showed normal pump function with mild wall thickness and trivial aortic valve regurgitation.  The patient has a family history of heart problems, with her father experiencing heart issues in his sixties. The patient is a former smoker, having quit when her son was 94 years old. The patient's LDL was 100, hemoglobin A1c was 6.1, hemoglobin was 11.9, creatinine was 0.7, potassium was 3.7, and TSH was 1.3 in June 2024.            Studies Reviewed: Marland Kitchen   EKG Interpretation Date/Time:  Monday January 02 2023 10:04:46 EST Ventricular  Rate:  79 PR Interval:  160 QRS Duration:  80 QT Interval:  356 QTC Calculation: 408 R Axis:   39  Text Interpretation: Normal sinus rhythm Nonspecific T wave abnormality No previous ECGs available Confirmed by Donato Schultz ([id number]) on 01/02/2023 10:09:43 AM    Results LABS LDL: 100 (05/2021) HbA1c: 6.1 (05/2021) Hb: 11.9 (05/2021) Cr: 0.7 (05/2021) K: 3.7 (05/2021) TSH: 1.3 (07/2022)  DIAGNOSTIC EKG: Heart rate 65 bpm, no significant abnormalities Heart monitor: Normal sinus rhythm, no atrial fibrillation, average heart rate 75 bpm, no pauses Echocardiogram: Normal ejection fraction, mild left ventricular hypertrophy, trivial aortic valve regurgitation, mild mitral valve regurgitation (2013)  Risk Assessment/Calculations:            Physical Exam:   VS:  BP 110/64   Pulse 77   Ht 5\' 2"  (1.575 m)   Wt 128 lb (58.1 kg)   SpO2 95%   BMI 23.41 kg/m    Wt Readings from Last 3 Encounters:  01/02/23 128 lb (58.1 kg)  10/03/22 130 lb (59 kg)  07/12/22 130 lb 1.6 oz (59 kg)    GEN: Well nourished, well developed in no acute distress NECK: No JVD; No carotid bruits CARDIAC: RRR, no murmurs, no rubs, no gallops RESPIRATORY:  Clear to auscultation without rales, wheezing or rhonchi  ABDOMEN: Soft, non-tender, non-distended EXTREMITIES:  No edema; No deformity   ASSESSMENT AND PLAN: .    Assessment and Plan    Palpitations Intermittent episodes of rapid heart rate, often associated with stress. Last episode in  June 2024, resolved spontaneously after 45 minutes. Previous EKG and heart monitor results normal. Possible PSVT. No syncope. Mild mitral and aortic valve regurgitation on 2013 echocardiogram. Discussed triggers and management strategies including vagal maneuvers. Medication like metoprolol or Cardizem considered if episodes increase. Echocardiogram to assess current cardiac function and valve status. - Order echocardiogram - Educate on vagal maneuvers (e.g., bearing  down, blowing through a straw) - PRN follow-up if episodes increase, could try metoprolol PRN then if needed  Hypertension Well-controlled on telmisartan 40 mg daily. Blood pressure within normal range during visit. - Continue telmisartan 40 mg daily - Monitor blood pressure regularly  General Health Maintenance Scheduled for cataract surgery on January 20, 2023. Recent labs: LDL 100, HbA1c 6.1, hemoglobin 11.9, creatinine 0.7, potassium 3.7, TSH 1.3. Cleared for surgery from a cardiac perspective. - Proceed with cataract surgery as planned - Routine follow-up for lab work as needed  Follow-up - PRN follow-up for palpitations.             Signed, Donato Schultz, MD

## 2023-01-02 NOTE — Patient Instructions (Signed)
Medication Instructions:  The current medical regimen is effective;  continue present plan and medications.  *If you need a refill on your cardiac medications before your next appointment, please call your pharmacy*  Testing/Procedures: Your physician has requested that you have an echocardiogram. Echocardiography is a painless test that uses sound waves to create images of your heart. It provides your doctor with information about the size and shape of your heart and how well your heart's chambers and valves are working. This procedure takes approximately one hour. There are no restrictions for this procedure. Please do NOT wear cologne, perfume, aftershave, or lotions (deodorant is allowed). Please arrive 15 minutes prior to your appointment time.  Please note: We ask at that you not bring children with you during ultrasound (echo/ vascular) testing. Due to room size and safety concerns, children are not allowed in the ultrasound rooms during exams. Our front office staff cannot provide observation of children in our lobby area while testing is being conducted. An adult accompanying a patient to their appointment will only be allowed in the ultrasound room at the discretion of the ultrasound technician under special circumstances. We apologize for any inconvenience.   Follow-Up: At [facility], you and your health needs are our priority.  As part of our continuing mission to provide you with exceptional heart care, we have created designated Provider Care Teams.  These Care Teams include your primary Cardiologist (physician) and Advanced Practice Providers (APPs -  Physician Assistants and Nurse Practitioners) who all work together to provide you with the care you need, when you need it.  We recommend signing up for the patient portal called "MyChart".  Sign up information is provided on this After Visit Summary.  MyChart is used to connect with patients for Virtual Visits (Telemedicine).   Patients are able to view lab/test results, encounter notes, upcoming appointments, etc.  Non-urgent messages can be sent to your provider as well.   To learn more about what you can do with MyChart, go to [email or web address].    Your next appointment:   Follow up as needed based on the results of the above testing.

## 2023-01-16 ENCOUNTER — Ambulatory Visit: Payer: Medicare Other | Admitting: Family Medicine

## 2023-01-16 ENCOUNTER — Encounter: Payer: Self-pay | Admitting: Family Medicine

## 2023-01-20 ENCOUNTER — Other Ambulatory Visit: Payer: Self-pay | Admitting: Family Medicine

## 2023-01-20 DIAGNOSIS — I10 Essential (primary) hypertension: Secondary | ICD-10-CM

## 2023-01-20 DIAGNOSIS — H268 Other specified cataract: Secondary | ICD-10-CM | POA: Diagnosis not present

## 2023-01-20 DIAGNOSIS — H25813 Combined forms of age-related cataract, bilateral: Secondary | ICD-10-CM | POA: Diagnosis not present

## 2023-01-25 ENCOUNTER — Ambulatory Visit (INDEPENDENT_AMBULATORY_CARE_PROVIDER_SITE_OTHER): Payer: Medicare Other | Admitting: Family Medicine

## 2023-01-25 ENCOUNTER — Ambulatory Visit: Payer: Medicare Other

## 2023-01-25 ENCOUNTER — Encounter: Payer: Self-pay | Admitting: Family Medicine

## 2023-01-25 VITALS — BP 138/70 | HR 85 | Temp 98.2°F | Ht 62.0 in | Wt 127.2 lb

## 2023-01-25 DIAGNOSIS — F419 Anxiety disorder, unspecified: Secondary | ICD-10-CM | POA: Diagnosis not present

## 2023-01-25 DIAGNOSIS — I10 Essential (primary) hypertension: Secondary | ICD-10-CM | POA: Diagnosis not present

## 2023-01-25 DIAGNOSIS — M25562 Pain in left knee: Secondary | ICD-10-CM

## 2023-01-25 DIAGNOSIS — M25462 Effusion, left knee: Secondary | ICD-10-CM | POA: Diagnosis not present

## 2023-01-25 DIAGNOSIS — S83207A Unspecified tear of unspecified meniscus, current injury, left knee, initial encounter: Secondary | ICD-10-CM | POA: Diagnosis not present

## 2023-01-25 DIAGNOSIS — M1712 Unilateral primary osteoarthritis, left knee: Secondary | ICD-10-CM | POA: Diagnosis not present

## 2023-01-25 DIAGNOSIS — G8929 Other chronic pain: Secondary | ICD-10-CM

## 2023-01-25 MED ORDER — CLONIDINE HCL 0.1 MG PO TABS: ORAL_TABLET | ORAL | 0 refills | Status: AC

## 2023-01-25 MED ORDER — PAROXETINE HCL 10 MG PO TABS: ORAL_TABLET | ORAL | 3 refills | Status: DC

## 2023-01-25 MED ORDER — MELOXICAM 15 MG PO TABS: 15.0000 mg | ORAL_TABLET | Freq: Every day | ORAL | 3 refills | Status: DC

## 2023-01-25 NOTE — Progress Notes (Signed)
Established Patient Office Visit  Subjective   Patient ID: Sydney Lucas, female    DOB: [birth year]  Age: 82 y.o. MRN: [id number]  Chief Complaint  Patient presents with   Medical Management of Chronic Issues   Patient requests to discuss DNR    Pt is here for 6 month follow up today.  HTN -- BP in office performed and is well controlled. She  reports no side effects to the medications, no chest pain, SOB, dizziness or headaches. She has a BP cuff at home and is checking BP regularly, reports they are in the normal range- she does get an occasional reading above 140 but usually these are isolated-- most pressures form home are in the 130's.   Left knee pain-- pt reports worsening of her knee pain. States that she tore a meniscus in the left knee about 15 years ago, since then she has been having pains in the knee intermittently. States that she is trying to walk more, get more physical activity but her knee is aching more now.   Anxiety-- pt states this is the first christmas she is spending since her husband passed away this year. She reports that the paroxetine continues to help with anxiety symptoms, she would like to continue this medication.     Current Outpatient Medications  Medication Instructions   alendronate (FOSAMAX) 70 MG tablet TAKE 1 TABLET BY MOUTH  WEEKLY WITH 8 OZ OF PLAIN  WATER 30 MINUTES BEFORE  FIRST FOOD, DRINK OR MEDS.  STAY UPRIGHT FOR 30 MINS   Calcium Carbonate-Vit D-Min (CALTRATE 600+D PLUS) 600-400 MG-UNIT per tablet 1 tablet, Daily   cloNIDine (CATAPRES) 0.1 MG tablet Take 1 tablet every 8 hours as needed for systolic BP greater than 180.   influenza vaccine adjuvanted (FLUAD) 0.5 ML injection 0.5 mLs, Intramuscular   meloxicam (MOBIC) 15 mg, Oral, Daily   Multiple Vitamin (MULTIVITAMIN) tablet 1 tablet, Daily   PARoxetine (PAXIL) 10 MG tablet TAKE 1 TABLET BY MOUTH  DAILY   psyllium (REGULOID) 0.52 g, Daily   telmisartan (MICARDIS) 40 MG  tablet TAKE 1 TABLET(40 MG) BY MOUTH DAILY   terbinafine (LAMISIL) 250 mg, Oral, Daily    Patient Active Problem List   Diagnosis Date Noted   HTN (hypertension) 02/08/2022   Anxiety 12/24/2021   Onychomycosis 12/24/2021   Osteoporosis 12/24/2021   History of colonic polyps 08/02/2011   Paroxysmal atrial fibrillation (HCC) 04/15/2011      Review of Systems  All other systems reviewed and are negative.     Objective:     BP 138/70   Pulse 85   Temp 98.2 F (36.8 C) (Oral)   Ht 5\' 2"  (1.575 m)   Wt 127 lb 3.2 oz (57.7 kg)   SpO2 99%   BMI 23.27 kg/m    Physical Exam Vitals reviewed.  Constitutional:      Appearance: Normal appearance. She is well-groomed and normal weight.  Cardiovascular:     Rate and Rhythm: Normal rate and regular rhythm.     Pulses: Normal pulses.     Heart sounds: S1 normal and S2 normal.  Pulmonary:     Effort: Pulmonary effort is normal.     Breath sounds: Normal breath sounds and air entry.  Musculoskeletal:     Right lower leg: No edema.     Left lower leg: No edema.  Neurological:     Mental Status: She is alert and oriented to person, place, and time. Mental status  is at baseline.     Gait: Gait is intact.  Psychiatric:        Mood and Affect: Mood and affect normal.        Speech: Speech normal.        Behavior: Behavior normal.      No results found for any visits on 01/25/23.    The ASCVD Risk score (Arnett DK, et al., 2019) failed to calculate for the following reasons:   The 2019 ASCVD risk score is only valid for ages 69 to 76    Assessment & Plan:  Chronic pain of left knee We discussed using PRN meloxicam for the pain, I will send an rx for this. Also will need to get new films to look at the severity of the arthritis-- will consider knee injection for pain relief vs referral to orthopedics.  -     DG Knee 1-2 Views Left; Future -     Meloxicam; Take 1 tablet (15 mg total) by mouth daily.  Dispense: 30 tablet;  Refill: 3  Primary hypertension Assessment & Plan: Current hypertension medications:       Sig   telmisartan (MICARDIS) 40 MG tablet (Taking) TAKE 1 TABLET(40 MG) BY MOUTH DAILY   cloNIDine (CATAPRES) 0.1 MG tablet Take 1 tablet every 8 hours as needed for systolic BP greater than 180.      Chronic, stable, BP is at goal today, I reviewed her home readings which are consistently below 140/90. Will continue the above medication as prescribed, she is not really  using the clonidine at home but would like to have this at home just in case of elevated BP.   Orders: -     cloNIDine HCl; Take 1 tablet every 8 hours as needed for systolic BP greater than 180.  Dispense: 60 tablet; Refill: 0  Anxiety Assessment & Plan: Pt reports that her husband passed away this year, however the paroxetine 10 mg daily is helping her with anxiety symptoms, she would like to continue the medication, refills sent to the pharmacy.   Orders: -     PARoxetine HCl; TAKE 1 TABLET BY MOUTH  DAILY  Dispense: 90 tablet; Refill: 3     Return in about 6 months (around 07/26/2023) for annual physical exam, come fasting for blood work.    Karie Georges, MD

## 2023-01-25 NOTE — Assessment & Plan Note (Signed)
Pt reports that her husband passed away this year, however the paroxetine 10 mg daily is helping her with anxiety symptoms, she would like to continue the medication, refills sent to the pharmacy.

## 2023-01-25 NOTE — Assessment & Plan Note (Signed)
Current hypertension medications:       Sig   telmisartan (MICARDIS) 40 MG tablet (Taking) TAKE 1 TABLET(40 MG) BY MOUTH DAILY   cloNIDine (CATAPRES) 0.1 MG tablet Take 1 tablet every 8 hours as needed for systolic BP greater than 180.      Chronic, stable, BP is at goal today, I reviewed her home readings which are consistently below 140/90. Will continue the above medication as prescribed, she is not really  using the clonidine at home but would like to have this at home just in case of elevated BP.

## 2023-02-07 ENCOUNTER — Ambulatory Visit (HOSPITAL_COMMUNITY): Payer: Medicare Other | Attending: Cardiology

## 2023-02-07 DIAGNOSIS — I051 Rheumatic mitral insufficiency: Secondary | ICD-10-CM | POA: Diagnosis not present

## 2023-02-07 LAB — ECHOCARDIOGRAM COMPLETE
AV Vena cont: 0.2 cm
S' Lateral: 2.61 cm

## 2023-02-13 ENCOUNTER — Encounter: Payer: Self-pay | Admitting: Family Medicine

## 2023-02-13 NOTE — Telephone Encounter (Signed)
 Please schedule patient an appointment for her knee injection-- ok to make a 15 minute appointment or double book if needed.

## 2023-02-13 NOTE — Telephone Encounter (Signed)
 Left a message for the patient to return my call.

## 2023-02-15 ENCOUNTER — Ambulatory Visit: Payer: Medicare Other

## 2023-02-15 ENCOUNTER — Encounter: Payer: Self-pay | Admitting: Family Medicine

## 2023-02-15 ENCOUNTER — Ambulatory Visit: Payer: Medicare Other | Admitting: Family Medicine

## 2023-02-15 VITALS — BP 140/60 | HR 70 | Temp 97.7°F | Ht 62.0 in | Wt 126.1 lb

## 2023-02-15 DIAGNOSIS — M1712 Unilateral primary osteoarthritis, left knee: Secondary | ICD-10-CM | POA: Diagnosis not present

## 2023-02-15 DIAGNOSIS — G8929 Other chronic pain: Secondary | ICD-10-CM | POA: Diagnosis not present

## 2023-02-15 DIAGNOSIS — M25562 Pain in left knee: Secondary | ICD-10-CM

## 2023-02-15 MED ORDER — TRIAMCINOLONE ACETONIDE 40 MG/ML IJ SUSP
40.0000 mg | Freq: Once | INTRAMUSCULAR | Status: AC
  Administered 2023-02-15: 40 mg via INTRA_ARTICULAR

## 2023-02-15 MED ORDER — TRIAMCINOLONE ACETONIDE 40 MG/ML IJ SUSP: 40.0000 mg | Freq: Once | INTRAMUSCULAR | Status: DC

## 2023-02-15 NOTE — Progress Notes (Signed)
   Established Patient Office Visit  Subjective   Patient ID: Sydney Lucas, female    DOB: [birth year]  Age: 83 y.o. MRN: [id number]  Chief Complaint  Patient presents with   Injections    Patient presents for left knee injection    Pt reports she has never had a knee injection in the past. I have reviewed the results of her left knee x-ray and I have examined the area. She does still have a small joint effusion. I counseled patient on the risks/benefits of knee injections and I explained the procedure to her. She verbally consented to the procedure.    Current Outpatient Medications  Medication Instructions   alendronate  (FOSAMAX ) 70 MG tablet TAKE 1 TABLET BY MOUTH  WEEKLY WITH 8 OZ OF PLAIN  WATER 30 MINUTES BEFORE  FIRST FOOD, DRINK OR MEDS.  STAY UPRIGHT FOR 30 MINS   Calcium Carbonate-Vit D-Min (CALTRATE 600+D PLUS) 600-400 MG-UNIT per tablet 1 tablet, Daily   cloNIDine  (CATAPRES ) 0.1 MG tablet Take 1 tablet every 8 hours as needed for systolic BP greater than 180.   influenza vaccine adjuvanted (FLUAD) 0.5 ML injection 0.5 mLs, Intramuscular   meloxicam  (MOBIC ) 15 mg, Oral, Daily   Multiple Vitamin (MULTIVITAMIN) tablet 1 tablet, Daily   PARoxetine  (PAXIL ) 10 MG tablet TAKE 1 TABLET BY MOUTH  DAILY   psyllium (REGULOID) 0.52 g, Daily   telmisartan  (MICARDIS ) 40 MG tablet TAKE 1 TABLET(40 MG) BY MOUTH DAILY   terbinafine  (LAMISIL ) 250 mg, Oral, Daily       Review of Systems  All other systems reviewed and are negative.     Objective:     BP (!) 140/60   Pulse 70   Temp 97.7 F (36.5 C) (Oral)   Ht 5' 2 (1.575 m)   Wt 126 lb 1.6 oz (57.2 kg)   SpO2 98%   BMI 23.06 kg/m    Physical Exam Vitals reviewed.  Constitutional:      Appearance: Normal appearance. She is normal weight.  Musculoskeletal:        General: Swelling (small left joint effusion) present. No tenderness or deformity.  Neurological:     Mental Status: She is alert and oriented  to person, place, and time.      No results found for any visits on 02/15/23.  Joint Injection/Arthrocentesis  Date/Time: 02/15/2023 11:37 AM  Performed by: Ozell Heron HERO, MD Authorized by: Ozell Heron HERO, MD  Indications: pain and joint swelling  Body area: knee Joint: left knee Local anesthesia used: no  Anesthesia: Local anesthesia used: no  Sedation: Patient sedated: no  Preparation: Patient was prepped and draped in the usual sterile fashion. Needle size: 20 G Ultrasound guidance: no Approach: medial Aspirate amount: 0 mL Triamcinolone  amount: 40 mg Lidocaine 2% amount: 2 mL Patient tolerance: patient tolerated the procedure well with no immediate complications       The ASCVD Risk score (Arnett DK, et al., 2019) failed to calculate for the following reasons:   The 2019 ASCVD risk score is only valid for ages 84 to 64    Assessment & Plan:   Primary osteoarthritis of left knee -     Triamcinolone  Acetonide  Other orders -     Arthrocentesis  Patient tolerated the procedure well, will follow up for additional injections as needed for pain management.    No follow-ups on file.    Heron HERO Ozell, MD

## 2023-02-15 NOTE — Telephone Encounter (Signed)
 Patient has been scheduled for an appt today with PCP at 10:30am.

## 2023-02-20 DIAGNOSIS — H25811 Combined forms of age-related cataract, right eye: Secondary | ICD-10-CM | POA: Diagnosis not present

## 2023-02-20 DIAGNOSIS — Z961 Presence of intraocular lens: Secondary | ICD-10-CM | POA: Diagnosis not present

## 2023-02-20 DIAGNOSIS — H52203 Unspecified astigmatism, bilateral: Secondary | ICD-10-CM | POA: Diagnosis not present

## 2023-02-24 ENCOUNTER — Other Ambulatory Visit: Payer: Self-pay | Admitting: Family Medicine

## 2023-02-24 DIAGNOSIS — F419 Anxiety disorder, unspecified: Secondary | ICD-10-CM

## 2023-03-24 DIAGNOSIS — Z1231 Encounter for screening mammogram for malignant neoplasm of breast: Secondary | ICD-10-CM | POA: Diagnosis not present

## 2023-03-24 LAB — HM MAMMOGRAPHY

## 2023-03-27 ENCOUNTER — Encounter: Payer: Self-pay | Admitting: Family Medicine

## 2023-03-30 ENCOUNTER — Other Ambulatory Visit: Payer: Self-pay | Admitting: Family Medicine

## 2023-04-18 ENCOUNTER — Other Ambulatory Visit: Payer: Self-pay | Admitting: Family Medicine

## 2023-04-18 DIAGNOSIS — I10 Essential (primary) hypertension: Secondary | ICD-10-CM

## 2023-04-25 DIAGNOSIS — H25811 Combined forms of age-related cataract, right eye: Secondary | ICD-10-CM | POA: Diagnosis not present

## 2023-04-25 DIAGNOSIS — H268 Other specified cataract: Secondary | ICD-10-CM | POA: Diagnosis not present

## 2023-04-25 DIAGNOSIS — H401121 Primary open-angle glaucoma, left eye, mild stage: Secondary | ICD-10-CM | POA: Diagnosis not present

## 2023-04-25 DIAGNOSIS — H2511 Age-related nuclear cataract, right eye: Secondary | ICD-10-CM | POA: Diagnosis not present

## 2023-04-26 ENCOUNTER — Encounter: Payer: Medicare Other | Admitting: Family Medicine

## 2023-05-18 ENCOUNTER — Encounter: Payer: Self-pay | Admitting: Family Medicine

## 2023-05-19 ENCOUNTER — Encounter: Payer: Self-pay | Admitting: Family Medicine

## 2023-05-19 DIAGNOSIS — I10 Essential (primary) hypertension: Secondary | ICD-10-CM

## 2023-05-21 MED ORDER — AMLODIPINE BESYLATE 5 MG PO TABS: 5.0000 mg | ORAL_TABLET | Freq: Every day | ORAL | 0 refills | Status: DC

## 2023-05-22 ENCOUNTER — Encounter: Payer: Self-pay | Admitting: Family Medicine

## 2023-05-24 ENCOUNTER — Encounter: Payer: Self-pay | Admitting: Family Medicine

## 2023-05-24 NOTE — Telephone Encounter (Signed)
 FYI

## 2023-05-24 NOTE — Telephone Encounter (Signed)
**Note De-identified  [place] Obfuscation** Please advise 

## 2023-05-29 ENCOUNTER — Telehealth: Payer: Self-pay

## 2023-05-29 NOTE — Telephone Encounter (Signed)
 Copied from CRM [id number]. Topic: Clinical - Medical Advice >> May 25, 2023 12:23 PM Lonzell Robin C wrote: Reason for CRM: Patient BP reading is still high unless she takes cloNIDine  (CATAPRES ) 0.1 MG tablet. Please contact patient regarding this matter

## 2023-06-13 ENCOUNTER — Encounter: Payer: Self-pay | Admitting: Family Medicine

## 2023-06-24 ENCOUNTER — Encounter: Payer: Self-pay | Admitting: Family Medicine

## 2023-06-26 ENCOUNTER — Telehealth: Payer: Self-pay | Admitting: Cardiology

## 2023-06-26 DIAGNOSIS — R002 Palpitations: Secondary | ICD-10-CM

## 2023-06-26 DIAGNOSIS — R000 Tachycardia, unspecified: Secondary | ICD-10-CM

## 2023-06-26 NOTE — Telephone Encounter (Signed)
 Left message for patient to call back

## 2023-06-26 NOTE — Telephone Encounter (Signed)
 Pt called in to report: I have visited Dr. Renna Cary before about these episodes of rapid heartbeat, but this time the problem might be caused by having had the shingrix vaccine. I had an allergic reaction to the vaccine, which included rapid heart rate, dizziness, fatigue, hives. I had the vaccine almost three weeks ago, and I am still having the symptoms. My concern is about whether I am still having side effects, or is something else going on.  She is having a fast heart beat that caused her tot have dizziness that only lasts a few minutes..it happens daily..  she is not having any SOB. No chest pain.   Per Dr Renna Cary last note/ 12/2022: PRN follow-up if episodes increase, could try metoprolol PRN then if needed   Will send to Dr Allana Ishikawa for review.

## 2023-06-26 NOTE — Telephone Encounter (Signed)
 Patient returned RN's call.

## 2023-06-26 NOTE — Telephone Encounter (Signed)
  Per MyChart scheduling message:  Initial complaint: Episodes of rapid heartbeat and dizziness   STAT if HR is under 50 or over 120  (normal HR is 60-100 beats per minute)  What is your heart rate?   Do you have a log of your heart rate readings (document readings)?   Do you have any other symptoms?    I don't have a log of my heart rate readings, but I do have a log of my pulse rates because I have high blood pressure and take readings every day. My pulse rate ranges from 62 to 69 generally.   I have visited Dr. Renna Cary before about these episodes of rapid heartbeat, but this time the problem might be caused by having had the shingrix vaccine. I had an allergic reaction to the vaccine, which included rapid heart rate, dizziness, fatigue, hives. I had the vaccine almost three weeks ago, and I am still having the symptoms. My concern is about whether I am still having side effects, or is something else going on.

## 2023-06-27 ENCOUNTER — Ambulatory Visit: Payer: Self-pay | Admitting: Family Medicine

## 2023-06-27 ENCOUNTER — Ambulatory Visit: Attending: Cardiology

## 2023-06-27 ENCOUNTER — Ambulatory Visit: Payer: Self-pay

## 2023-06-27 ENCOUNTER — Ambulatory Visit (INDEPENDENT_AMBULATORY_CARE_PROVIDER_SITE_OTHER): Admitting: Family Medicine

## 2023-06-27 VITALS — BP 124/68 | HR 71 | Temp 97.7°F | Wt 126.2 lb

## 2023-06-27 DIAGNOSIS — I491 Atrial premature depolarization: Secondary | ICD-10-CM | POA: Diagnosis not present

## 2023-06-27 DIAGNOSIS — R000 Tachycardia, unspecified: Secondary | ICD-10-CM

## 2023-06-27 DIAGNOSIS — R002 Palpitations: Secondary | ICD-10-CM

## 2023-06-27 NOTE — Progress Notes (Unsigned)
 Enrolled for [employer] to mail a ZIO XT long term holter monitor to the patients address on file.

## 2023-06-27 NOTE — Telephone Encounter (Signed)
 Hugh Madura, MD  You; Alanna Alley, RN25 minutes ago (3:48 PM)    Given her episodes of palpitations, tachycardia, lets go ahead and order a Zio patch monitor for 14 days.  This will be helpful to make sure that there are no significant adverse arrhythmias detected. Dorothye Gathers, MD   Order placed for zio monitor to evaluate palps and tachycardia. Instructions sent to pt via MyChart

## 2023-06-27 NOTE — Telephone Encounter (Signed)
  Chief Complaint: Dizziness, racing heart Symptoms: see above Frequency: see notes Pertinent Negatives: Patient denies sweating, loss of vision, losing consciousness Disposition: [] ED /[] Urgent Care (no appt availability in office) / [x] Appointment(In office/virtual)/ []  [facility]/ [] Home Care/ [] Refused Recommended Disposition /[] [facility]/ []  Follow-up with PCP Additional Notes: Patient transferred to Nurse Triage line by [facility]. Patient had made appt to be evaluated for dizziness and racing heart. Per nurse triage, patient states she has been experiencing intermittent dizziness and racing heart for a few years and has been followed by Cardiology. Cardiology has performed an echo and made suggestions for her. However; patient received Shingles Vaccine 3 weeks ago and states her episodes have become worse. Patient states it happens daily, sometimes twice a day and sometimes multiple times. Patient describes episodes as lasting roughly 5 minutes, and she experiences a racing heart and dizziness/feeling faint. Patient states initially after shingles vaccine, she had hives and decreased energy. Patient appt made for today for further evaluation.   Reason for Disposition  [1] Dizziness caused by heat exposure, sudden standing, or poor fluid intake AND [2] no improvement after 2 hours of rest and fluids  Answer Assessment - Initial Assessment Questions 1. DESCRIPTION: "Describe your dizziness."     Feeling off balance with a racing heart for a short episode - 5 minutes 2. LIGHTHEADED: "Do you feel lightheaded?" (e.g., somewhat faint, woozy, weak upon standing)     'feel off balance 3. VERTIGO: "Do you feel like either you or the room is spinning or tilting?" (i.e. vertigo)     No 4. SEVERITY: "How bad is it?"  "Do you feel like you are going to faint?" "Can you stand and walk?"   - MILD: Feels slightly dizzy, but walking normally.   - MODERATE: Feels unsteady  when walking, but not falling; interferes with normal activities (e.g., school, work).   - SEVERE: Unable to walk without falling, or requires assistance to walk without falling; feels like passing out now.      Moderate 5. ONSET:  "When did the dizziness begin?"     Several years but worsened over last 2.5 weeks after shingles 6. AGGRAVATING FACTORS: "Does anything make it worse?" (e.g., standing, change in head position)     No 7. HEART RATE: "Can you tell me your heart rate?" "How many beats in 15 seconds?"  (Note: not all patients can do this)       Feels heart is racing 8. CAUSE: "What do you think is causing the dizziness?"     Unsure 9. RECURRENT SYMPTOM: "Have you had dizziness before?" If Yes, ask: "When was the last time?" "What happened that time?"     Yes intermittently for years  10. OTHER SYMPTOMS: "Do you have any other symptoms?" (e.g., fever, chest pain, vomiting, diarrhea, bleeding)       Improving weakness  Protocols used: Dizziness - Lightheadedness-A-AH

## 2023-06-27 NOTE — Telephone Encounter (Signed)
 Patient was scheduled for an appt today with Dr Darren Em.

## 2023-06-27 NOTE — Progress Notes (Signed)
 Established Patient Office Visit  Subjective   Patient ID: Sydney Lucas, female    DOB: Jul 26, 1940  Age: 83 y.o. MRN: [id number]  Chief Complaint  Patient presents with   Tachycardia    Patient complains of tachycardia, x2.5 weeks    Dizziness    Patient complains of dizziness, x2.5 weeks     HPI   Sydney Lucas is seen with onset about 2 and half weeks ago of intermittent episodes of subjective "tachycardia ".  She states her normal baseline heart rate is usually around 60-64 and she has had episodes that can last usually minutes in the 80s.  Has never counted rate above 90.  She relates getting shingles vaccine few weeks ago and developed some hives and presumed allergic reaction afterwards.  She feels like that is when her symptoms started.  No chest pains.  She is very health-conscious and active.  She walks about 30 minutes/day for exercise and also bicycles 20 minutes/day and has never had any chest pain or intolerance with activity recently.  Usually drinks 1 to 2 cups of coffee in the morning.  No alcohol.  Her problem list includes "paroxysmal atrial fibrillation"-but she does not have any recollection of this.  She had cardiac event monitor 2018 which showed no significant arrhythmias.  She had echocardiogram last December which showed normal EF.  Mild posterior leaflet mitral valve prolapse with trivial regurgitation.  This was reassuring overall.  Past Medical History:  Diagnosis Date   Anxiety    Headache(784.0)    HTN (hypertension)    Infected finger    Palpitations    No past surgical history on file.  reports that she quit smoking about 55 years ago. Her smoking use included cigarettes. She has never used smokeless tobacco. She reports that she does not drink alcohol and does not use drugs. family history includes CAD in her father; Diabetes in her father. Allergies  Allergen Reactions   Imipramine Nausea Only and Anxiety    Blurred vision,  increased heart rate,    Bactrim  [Sulfamethoxazole -Trimethoprim ] Rash    Rash, headache, nausea and diarrhea   Penicillins     REACTION: rash   Tramadol  Anxiety    Review of Systems  Constitutional:  Negative for chills, fever and weight loss.  Respiratory:  Negative for cough and shortness of breath.   Cardiovascular:  Positive for palpitations. Negative for chest pain, orthopnea, leg swelling and PND.  Neurological:  Negative for dizziness and loss of consciousness.      Objective:     BP 124/68 (BP Location: Left Arm, Patient Position: Sitting, Cuff Size: Normal)   Pulse 71   Temp 97.7 F (36.5 C) (Oral)   Wt 126 lb 3.2 oz (57.2 kg)   SpO2 96%   BMI 23.08 kg/m  BP Readings from Last 3 Encounters:  06/27/23 124/68  02/15/23 (!) 140/60  01/25/23 138/70   Wt Readings from Last 3 Encounters:  06/27/23 126 lb 3.2 oz (57.2 kg)  02/15/23 126 lb 1.6 oz (57.2 kg)  01/25/23 127 lb 3.2 oz (57.7 kg)      Physical Exam Vitals reviewed.  Constitutional:      General: She is not in acute distress.    Appearance: She is not ill-appearing.  Cardiovascular:     Rate and Rhythm: Normal rate and regular rhythm.  Pulmonary:     Effort: Pulmonary effort is normal.     Breath sounds: Normal breath sounds. No wheezing or rales.  Musculoskeletal:     Cervical back: Neck supple.     Right lower leg: No edema.     Left lower leg: No edema.  Lymphadenopathy:     Cervical: No cervical adenopathy.  Neurological:     Mental Status: She is alert.      No results found for any visits on 06/27/23.    The ASCVD Risk score (Arnett DK, et al., 2019) failed to calculate for the following reasons:   The 2019 ASCVD risk score is only valid for ages 74 to 15    Assessment & Plan:   83 year old female who presents with almost 3-week history of some subjective palpitations and intermittent increased heart rate above her baseline.  No true tachycardia.  She is counted pulse in the 80s.   She is not aware of any obvious irregularities.  Recent echo last December as above with normal EF  -EKG today in office shows frequent PACs.  No other acute changes. - Check labs including basic metabolic panel, TSH, magnesium - Scale back caffeine and chocolate - We did discuss possible further outpatient heart rhythm monitoring with ZIO but she had fairly significant adhesive reaction last time she did this. - We discussed possible suppression as needed with low-dose beta-blocker such as metoprolol but she would like to observe for now. - Set up follow-up with cardiology if symptoms persist or worsen  Sydney Lamy, MD

## 2023-06-28 ENCOUNTER — Encounter: Payer: Self-pay | Admitting: Family Medicine

## 2023-06-28 LAB — BASIC METABOLIC PANEL WITH GFR
BUN: 28 mg/dL — ABNORMAL HIGH (ref 6–23)
CO2: 23 meq/L (ref 19–32)
Calcium: 9.6 mg/dL (ref 8.4–10.5)
Chloride: 106 meq/L (ref 96–112)
Creatinine, Ser: 0.86 mg/dL (ref 0.40–1.20)
GFR: 61.99 mL/min (ref >60.00)
Glucose, Bld: 92 mg/dL (ref 70–99)
Potassium: 3.7 meq/L (ref 3.5–5.1)
Sodium: 139 meq/L (ref 135–145)

## 2023-06-28 LAB — TSH: TSH: 2.11 u[IU]/mL (ref 0.35–5.50)

## 2023-06-28 LAB — MAGNESIUM: Magnesium: 2 mg/dL (ref 1.5–2.5)

## 2023-07-05 ENCOUNTER — Ambulatory Visit: Admitting: Family Medicine

## 2023-07-29 ENCOUNTER — Other Ambulatory Visit: Payer: Self-pay | Admitting: Family Medicine

## 2023-07-29 DIAGNOSIS — G8929 Other chronic pain: Secondary | ICD-10-CM

## 2023-07-31 DIAGNOSIS — R000 Tachycardia, unspecified: Secondary | ICD-10-CM | POA: Diagnosis not present

## 2023-07-31 DIAGNOSIS — R002 Palpitations: Secondary | ICD-10-CM | POA: Diagnosis not present

## 2023-08-08 ENCOUNTER — Ambulatory Visit: Payer: Self-pay | Admitting: Cardiology

## 2023-08-08 DIAGNOSIS — R000 Tachycardia, unspecified: Secondary | ICD-10-CM

## 2023-08-08 DIAGNOSIS — I4892 Unspecified atrial flutter: Secondary | ICD-10-CM

## 2023-08-08 DIAGNOSIS — R002 Palpitations: Secondary | ICD-10-CM | POA: Diagnosis not present

## 2023-08-08 MED ORDER — APIXABAN 2.5 MG PO TABS: 2.5000 mg | ORAL_TABLET | Freq: Two times a day (BID) | ORAL | 6 refills | Status: DC

## 2023-08-09 ENCOUNTER — Encounter: Payer: Self-pay | Admitting: Cardiology

## 2023-08-10 ENCOUNTER — Other Ambulatory Visit (HOSPITAL_COMMUNITY): Payer: Self-pay

## 2023-08-10 ENCOUNTER — Telehealth: Payer: Self-pay

## 2023-08-10 NOTE — Telephone Encounter (Signed)
 STAFF ONSITE AGAIN ON 7/7 TO MAIL ELIQUIS PAP APP

## 2023-08-10 NOTE — Telephone Encounter (Signed)
 We will mail patient an Eliquis PAP application

## 2023-08-14 ENCOUNTER — Encounter: Payer: Self-pay | Admitting: Cardiology

## 2023-08-14 ENCOUNTER — Other Ambulatory Visit: Payer: Self-pay | Admitting: *Deleted

## 2023-08-14 ENCOUNTER — Other Ambulatory Visit (HOSPITAL_COMMUNITY): Payer: Self-pay

## 2023-08-14 DIAGNOSIS — I480 Paroxysmal atrial fibrillation: Secondary | ICD-10-CM

## 2023-08-14 MED ORDER — APIXABAN 2.5 MG PO TABS: 2.5000 mg | ORAL_TABLET | Freq: Two times a day (BID) | ORAL | 5 refills | Status: DC

## 2023-08-14 NOTE — Telephone Encounter (Signed)
 Eliquis  2.5mg  refill request received. Patient is 83 years old, weight-57.2kg, Crea-0.86 on 06/27/23, Diagnosis-Afib, and last seen by Dr. Jeffrie on 01/02/23. Dose is appropriate based on dosing criteria. Will send in refill to requested pharmacy.

## 2023-08-14 NOTE — Telephone Encounter (Signed)
 PAP application for (Eliquis  BMS)  has been mailed to pt home. I will fax provider pages once I receive pt pages

## 2023-08-15 ENCOUNTER — Other Ambulatory Visit: Payer: Self-pay | Admitting: Family Medicine

## 2023-08-15 DIAGNOSIS — I10 Essential (primary) hypertension: Secondary | ICD-10-CM

## 2023-08-21 ENCOUNTER — Encounter: Payer: Self-pay | Admitting: Family Medicine

## 2023-08-21 ENCOUNTER — Other Ambulatory Visit: Payer: Self-pay | Admitting: Family Medicine

## 2023-08-21 DIAGNOSIS — F419 Anxiety disorder, unspecified: Secondary | ICD-10-CM

## 2023-08-22 ENCOUNTER — Encounter: Payer: Self-pay | Admitting: Family Medicine

## 2023-08-26 ENCOUNTER — Encounter: Payer: Self-pay | Admitting: Family Medicine

## 2023-08-28 NOTE — Telephone Encounter (Signed)
 2ND ATTEMPT PAP application for (Eliquis  BMS)  has been mailed to pt home. I will fax provider pages once I receive pt pages

## 2023-09-04 NOTE — Telephone Encounter (Addendum)
 Pt portion received, reviewing. Provider portion in mailbox, waiting on provider signature.

## 2023-09-04 NOTE — Telephone Encounter (Addendum)
 Please have provider complete their portion of patient's PAP application (see chart media). Please fax the completed signed and dated form to [phone].

## 2023-09-05 NOTE — Progress Notes (Signed)
 Electrophysiology Office Note:    Date:  09/06/2023   ID:  Sydney Lucas, Sydney Lucas 05-03-1940, MRN [id number]  PCP:  Ozell Heron HERO, MD   [facility] Providers Cardiologist:  None     Referring MD: Jeffrie Oneil BROCKS, MD   History of Present Illness:    Sydney Lucas is a 83 y.o. female with a medical history significant for hypertension, referred for evaluation and management of palpitations and bradycardia.     She saw Dr. Jeffrie in November 2024 for evaluation of palpitations. at that time she noted intermittent rapid heart rates associated with stress  Discussed the use of AI scribe software for clinical note transcription with the patient, who gave verbal consent to proceed.  History of Present Illness Sydney Lucas is an 83 year old female with hypertension who presents with palpitations and bradycardia. She was referred by Dr. Farley for evaluation of palpitations and bradycardia.  She has experienced palpitations and bradycardia for several years, initially during periods of significant stress. Episodes involve a racing heart, initially thought to be panic attacks. In November 2024, intermittent rapid heart rates associated with stress were noted. In May 2025, a monitor showed a 3% burden of premature atrial contractions and a 4% burden of high atrial rates.  Current episodes last about three minutes, whereas previously they could last up to an hour and a half. She sometimes becomes aware of her heart beating fast or out of rhythm at night.  She has not been taking medications like metoprolol or carvedilol. She was prescribed Eliquis  but has not been taking it regularly due to cost, with a five-day supply currently available.         Today, she reports that she is well and has no complaints.  EKGs/Labs/Other Studies Reviewed Today:     Echocardiogram:  TTE February 07, 2023 LVEF 60 to 65%.  Normal LV function.  Normal RV  systolic function.   Monitors:  14 day monitor May 2025-- my interpretation Sinus rhythm predominates. Sinus heart rate 51 to 103 bpm, average 69 bpm 3% burden of PACs, less than 1% burden PVCs. Nocturnal pauses occurred. 4% burden of atrial high rates --, narrow complex, regular rhythm with short PR; possible flutter with low amplitude flutter waves.  Some EKGs are consistent with a slow atrial flutter versus atrial tachycardia (eg 07/17/23 2:26:57 AM) Symptoms correlate with atrial and ventricular ectopy and also with SVT   EKG:   EKG Interpretation Date/Time:  Wednesday September 06 2023 14:29:32 EDT Ventricular Rate:  108 PR Interval:  200 QRS Duration:  78 QT Interval:  266 QTC Calculation: 356 R Axis:   90  Text Interpretation: Recurrent atrial runs Rightward axis Nonspecific T wave abnormality When compared with ECG of 02-Jan-2023 10:04, Atrial ectopy present Confirmed by Nancey Scotts [phone]) on 09/06/2023 2:44:29 PM     Physical Exam:    VS:  BP 122/80 (BP Location: Right Arm, Patient Position: Sitting, Cuff Size: Normal)   Pulse (!) 108   Ht 5' 2 (1.575 m)   Wt 123 lb 14.4 oz (56.2 kg)   SpO2 97%   BMI 22.66 kg/m     Wt Readings from Last 3 Encounters:  09/06/23 123 lb 14.4 oz (56.2 kg)  06/27/23 126 lb 3.2 oz (57.2 kg)  02/15/23 126 lb 1.6 oz (57.2 kg)     GEN: Well nourished, well developed in no acute distress CARDIAC: RRR, no murmurs, rubs, gallops RESPIRATORY:  Normal work of  breathing MUSCULOSKELETAL: no edema    ASSESSMENT & PLAN:     SVT - atrial tachycardia and atrial flutter I suspect the narrow complex tachycardia is a slow atrial flutter based upon monitor strips. EKG today shows atrial runs. Flutter wave can be seen particularly after a post-conversion pause on 07/17/23 at 03:17:47 as well as on the 07/16/23 10:52 strip. Symptoms are mild per patient, episodes are brief We discussed management options.  At present, with brief episodes of  questionable flutter with controlled rates as well as atrial tachycardia, I am not sure that the risk of intrinsic drugs or ablation at her age or warranted  Continue to monitor. If she has more persistent episodes lasting several hours, we may want to consider antiarrhythmic drug since we will need to address flutter and AT, the latter of which is not as conducive to ablation  Pauses, bradycardia Appear to be nocturnal No symptoms  Secondary hypercoagulable state CHA2DS2-VASc score is 3 I recommend continuing apixaban   Hypertension BP well-controlled today      Signed, Eulas FORBES Furbish, MD  09/06/2023 2:56 PM    [facility]

## 2023-09-06 ENCOUNTER — Ambulatory Visit: Attending: Cardiology | Admitting: Cardiovascular Disease

## 2023-09-06 ENCOUNTER — Encounter: Payer: Self-pay | Admitting: Cardiovascular Disease

## 2023-09-06 VITALS — BP 122/80 | HR 108 | Ht 62.0 in | Wt 123.9 lb

## 2023-09-06 DIAGNOSIS — R002 Palpitations: Secondary | ICD-10-CM

## 2023-09-06 DIAGNOSIS — I4892 Unspecified atrial flutter: Secondary | ICD-10-CM | POA: Diagnosis not present

## 2023-09-06 DIAGNOSIS — I480 Paroxysmal atrial fibrillation: Secondary | ICD-10-CM | POA: Diagnosis not present

## 2023-09-06 DIAGNOSIS — R000 Tachycardia, unspecified: Secondary | ICD-10-CM | POA: Diagnosis not present

## 2023-09-06 NOTE — Patient Instructions (Signed)
 Medication Instructions:  Your physician recommends that you continue on your current medications as directed. Please refer to the Current Medication list given to you today.  *If you need a refill on your cardiac medications before your next appointment, please call your pharmacy*  Lab Work: None ordered.  If you have labs (blood work) drawn today and your tests are completely normal, you will receive your results only by: MyChart Message (if you have MyChart) OR A paper copy in the mail If you have any lab test that is abnormal or we need to change your treatment, we will call you to review the results.  Testing/Procedures: None ordered.   Follow-Up: At [facility], you and your health needs are our priority.  As part of our continuing mission to provide you with exceptional heart care, our providers are all part of one team.  This team includes your primary Cardiologist (physician) and Advanced Practice Providers or APPs (Physician Assistants and Nurse Practitioners) who all work together to provide you with the care you need, when you need it.  Your next appointment:   6 months with Dr Arlester Ladd  We recommend signing up for the patient portal called "MyChart".  Sign up information is provided on this After Visit Summary.  MyChart is used to connect with patients for Virtual Visits (Telemedicine).  Patients are able to view lab/test results, encounter notes, upcoming appointments, etc.  Non-urgent messages can be sent to your provider as well.   To learn more about what you can do with MyChart, go to [email or web address].

## 2023-09-14 ENCOUNTER — Telehealth: Payer: Self-pay | Admitting: *Deleted

## 2023-09-14 NOTE — Telephone Encounter (Signed)
 No return call made as we do not give this information over the phone.

## 2023-09-14 NOTE — Telephone Encounter (Signed)
 Copied from CRM #[id number]. Topic: Medical Record Request - Provider/Facility Request >> Sep 13, 2023  4:22 PM Ivette P wrote: Reason for CRM: Charlena called in to get a confirmation on the Pt Diagnosis, called [facility] and was advised no nurse available at this time and only the provider or nurse can confirm diagnosis.    Pls call [facility] back with Diagnosis confirmation  Tom callback - [phone] - secured line

## 2023-09-18 NOTE — Telephone Encounter (Signed)
 Just FYI - Dr Jeffrie is not back in the office until 10/02/23.  Thank you

## 2023-09-18 NOTE — Telephone Encounter (Signed)
 Would DOD be able to sign instead?

## 2023-09-21 NOTE — Telephone Encounter (Signed)
 PROVIDER PORTION RECEIVED PAP: Application for Eliquis  has been submitted to [employer] ([employer]), via fax. If patient requests update in meantime, please refer them to [employer] at [phone]

## 2023-09-21 NOTE — Telephone Encounter (Signed)
 Paperwork completed, signed and faxed to # requested.

## 2023-09-22 ENCOUNTER — Encounter: Payer: Self-pay | Admitting: Family Medicine

## 2023-09-25 NOTE — Telephone Encounter (Signed)
 PAP: Patient has been denied for patient assistance by [employer] ([employer]) due to 3% OOP NOT MET FOR 2025, LETTER HAS BEEN SENT TO PT

## 2023-10-04 ENCOUNTER — Ambulatory Visit: Admitting: Family Medicine

## 2023-10-04 ENCOUNTER — Encounter: Payer: Self-pay | Admitting: Cardiovascular Disease

## 2023-10-04 ENCOUNTER — Encounter: Payer: Self-pay | Admitting: Family Medicine

## 2023-10-04 VITALS — BP 96/58 | HR 40 | Temp 97.9°F | Ht 60.75 in | Wt 124.4 lb

## 2023-10-04 DIAGNOSIS — Z0000 Encounter for general adult medical examination without abnormal findings: Secondary | ICD-10-CM

## 2023-10-04 DIAGNOSIS — M810 Age-related osteoporosis without current pathological fracture: Secondary | ICD-10-CM | POA: Diagnosis not present

## 2023-10-04 DIAGNOSIS — I10 Essential (primary) hypertension: Secondary | ICD-10-CM | POA: Diagnosis not present

## 2023-10-04 DIAGNOSIS — R001 Bradycardia, unspecified: Secondary | ICD-10-CM

## 2023-10-04 DIAGNOSIS — Z131 Encounter for screening for diabetes mellitus: Secondary | ICD-10-CM

## 2023-10-04 LAB — CBC WITH DIFFERENTIAL/PLATELET
Basophils Absolute: 0.1 K/uL (ref 0.0–0.1)
Basophils Relative: 1 % (ref 0.0–3.0)
Eosinophils Absolute: 0.2 K/uL (ref 0.0–0.7)
Eosinophils Relative: 1.9 % (ref 0.0–5.0)
HCT: 37.3 % (ref 36.0–46.0)
Hemoglobin: 12.5 g/dL (ref 12.0–15.0)
Lymphocytes Relative: 28.5 % (ref 12.0–46.0)
Lymphs Abs: 2.3 K/uL (ref 0.7–4.0)
MCHC: 33.5 g/dL (ref 30.0–36.0)
MCV: 93.6 fl (ref 78.0–100.0)
Monocytes Absolute: 0.9 K/uL (ref 0.1–1.0)
Monocytes Relative: 10.9 % (ref 3.0–12.0)
Neutro Abs: 4.6 K/uL (ref 1.4–7.7)
Neutrophils Relative %: 57.7 % (ref 43.0–77.0)
Platelets: 292 K/uL (ref 150.0–400.0)
RBC: 3.98 Mil/uL (ref 3.87–5.11)
RDW: 13.9 % (ref 11.5–15.5)
WBC: 7.9 K/uL (ref 4.0–10.5)

## 2023-10-04 LAB — COMPREHENSIVE METABOLIC PANEL WITH GFR
ALT: 12 U/L (ref 0–35)
AST: 15 U/L (ref 0–37)
Albumin: 4.3 g/dL (ref 3.5–5.2)
Alkaline Phosphatase: 39 U/L (ref 39–117)
BUN: 31 mg/dL — ABNORMAL HIGH (ref 6–23)
CO2: 23 meq/L (ref 19–32)
Calcium: 9.4 mg/dL (ref 8.4–10.5)
Chloride: 105 meq/L (ref 96–112)
Creatinine, Ser: 1.13 mg/dL (ref 0.40–1.20)
GFR: 44.59 mL/min — ABNORMAL LOW (ref >60.00)
Glucose, Bld: 128 mg/dL — ABNORMAL HIGH (ref 70–99)
Potassium: 4.3 meq/L (ref 3.5–5.1)
Sodium: 138 meq/L (ref 135–145)
Total Bilirubin: 0.8 mg/dL (ref 0.2–1.2)
Total Protein: 7.4 g/dL (ref 6.0–8.3)

## 2023-10-04 LAB — LIPID PANEL
Cholesterol: 209 mg/dL — ABNORMAL HIGH (ref 0–200)
HDL: 46.4 mg/dL (ref >39.00)
LDL Cholesterol: 100 mg/dL — ABNORMAL HIGH (ref 0–99)
NonHDL: 162.37
Total CHOL/HDL Ratio: 4
Triglycerides: 312 mg/dL — ABNORMAL HIGH (ref 0.0–149.0)
VLDL: 62.4 mg/dL — ABNORMAL HIGH (ref 0.0–40.0)

## 2023-10-04 LAB — VITAMIN D 25 HYDROXY (VIT D DEFICIENCY, FRACTURES): VITD: 47.29 ng/mL (ref 30.00–100.00)

## 2023-10-04 LAB — HEMOGLOBIN A1C: Hgb A1c MFr Bld: 6.3 % (ref 4.6–6.5)

## 2023-10-04 MED ORDER — TELMISARTAN 40 MG PO TABS: 40.0000 mg | ORAL_TABLET | Freq: Every day | ORAL | 1 refills | Status: DC

## 2023-10-04 NOTE — Patient Instructions (Signed)

## 2023-10-04 NOTE — Progress Notes (Signed)
 Complete physical exam  Patient: Sydney Lucas   DOB: [birth year]   83 y.o. Female  MRN: [id number]  Subjective:    Chief Complaint  Patient presents with   Annual Exam    Sydney Lucas is a 83 y.o. female who presents today for a complete physical exam. She reports consuming a vegetarian diet. Does not eat meat but does eat eggs and dairy like cottage cheese and nuts, eat beans also. Does not eat ultraprocessed foods really, occasional potato chips. Drinks mostly coffee and water. Exercise is limited by orthopedic condition(s): knee OA, walks 20 minutes a day and uses the exercise bike for about 20 minutes. Has to stop due to knee pain. She generally feels well. She reports sleeping well. She does have additional problems to discuss today.   Physical exam findings shows an irregular heart beat so EKG was ordered for today.she just saw Dr. Nancey last month and had a 14 day holter monitor, she does have paroxysmal atrial fibrillation/flutter and is on chronic Eliquis  therapy. She denies any dizziness, SOB or exercise intolerance.   Most recent fall risk assessment:    10/04/2023   10:51 AM  Fall Risk   Falls in the past year? 1  Number falls in past yr: 0  Injury with Fall? 0  Risk for fall due to : Impaired balance/gait  Follow up Falls evaluation completed     Most recent depression screenings:    10/04/2023   10:52 AM 10/03/2022    1:21 PM  PHQ 2/9 Scores  PHQ - 2 Score 0 0  PHQ- 9 Score 1     Vision:Within last year -- had cataract surgery earlier in the year, seeing well. and Dental: goes for regular cleanings, does have a broken tooth that needs a crown.  Patient Active Problem List   Diagnosis Date Noted   HTN (hypertension) 02/08/2022   Anxiety 12/24/2021   Onychomycosis 12/24/2021   Osteoporosis 12/24/2021   History of colonic polyps 08/02/2011   Paroxysmal atrial fibrillation (HCC) 04/15/2011      Patient Care Team: Ozell Heron HERO, MD as PCP - General (Family Medicine)   Outpatient Medications Prior to Visit  Medication Sig   alendronate  (FOSAMAX ) 70 MG tablet TAKE 1 TABLET BY MOUTH WEEKLY  WITH 8 OZ OF PLAIN WATER 30  MINUTES BEFORE FIRST FOOD, DRINK OR MEDS. STAY UPRIGHT FOR 30  MINS   amLODipine  (NORVASC ) 5 MG tablet TAKE 1 TABLET(5 MG) BY MOUTH DAILY   apixaban  (ELIQUIS ) 2.5 MG TABS tablet Take 1 tablet (2.5 mg total) by mouth 2 (two) times daily.   Calcium Carbonate-Vit D-Min (CALTRATE 600+D PLUS) 600-400 MG-UNIT per tablet Take 1 tablet by mouth daily.   cloNIDine  (CATAPRES ) 0.1 MG tablet Take 1 tablet every 8 hours as needed for systolic BP greater than 180.   influenza vaccine adjuvanted (FLUAD) 0.5 ML injection Inject 0.5 mLs into the muscle.   meloxicam  (MOBIC ) 15 MG tablet TAKE 1 TABLET BY MOUTH DAILY   Multiple Vitamin (MULTIVITAMIN) tablet Take 1 tablet by mouth daily.   PARoxetine  (PAXIL ) 10 MG tablet TAKE 1 TABLET BY MOUTH DAILY   [DISCONTINUED] telmisartan  (MICARDIS ) 40 MG tablet TAKE 1 TABLET(40 MG) BY MOUTH DAILY   No facility-administered medications prior to visit.    Review of Systems  HENT:  Negative for hearing loss.   Eyes:  Negative for blurred vision.  Respiratory:  Negative for shortness of breath.   Cardiovascular:  Negative for chest  pain.  Gastrointestinal: Negative.   Genitourinary: Negative.   Musculoskeletal:  Negative for back pain.  Neurological:  Negative for headaches.  Psychiatric/Behavioral:  Negative for depression.        Objective:     BP (!) 96/58   Pulse (!) 40 Comment: performed manually--jaf  Temp 97.9 F (36.6 C) (Oral)   Ht 5' 0.75 (1.543 m)   Wt 124 lb 6.4 oz (56.4 kg)   SpO2 97%   BMI 23.70 kg/m    Physical Exam Vitals reviewed.  Constitutional:      Appearance: Normal appearance. She is well-groomed and normal weight.  HENT:     Right Ear: Tympanic membrane and ear canal normal.     Left Ear: Tympanic membrane and ear canal normal.      Mouth/Throat:     Mouth: Mucous membranes are moist.     Pharynx: No posterior oropharyngeal erythema.  Eyes:     Conjunctiva/sclera: Conjunctivae normal.  Neck:     Thyroid : No thyromegaly.  Cardiovascular:     Rate and Rhythm: Regular rhythm. Bradycardia present.     Pulses: Normal pulses.     Heart sounds: S1 normal and S2 normal.  Pulmonary:     Effort: Pulmonary effort is normal.     Breath sounds: Normal breath sounds and air entry.  Abdominal:     General: Abdomen is flat. Bowel sounds are normal.     Palpations: Abdomen is soft.  Musculoskeletal:     Right lower leg: No edema.     Left lower leg: No edema.  Lymphadenopathy:     Cervical: No cervical adenopathy.  Neurological:     Mental Status: She is alert and oriented to person, place, and time. Mental status is at baseline.     Gait: Gait is intact.  Psychiatric:        Mood and Affect: Mood and affect normal.        Speech: Speech normal.        Behavior: Behavior normal.        Judgment: Judgment normal.      No results found for any visits on 10/04/23.     Assessment & Plan:    Routine Health Maintenance and Physical Exam  Immunization History  Administered Date(s) Administered    sv, Bivalent, Protein Subunit Rsvpref,pf (Abrysvo) 11/29/2022   Fluad Quad(high Dose 65+) 11/01/2018, 12/08/2020, 12/07/2021   Fluad Trivalent(High Dose 65+) 09/23/2022   INFLUENZA, HIGH DOSE SEASONAL PF 11/24/2016, 11/09/2017   Influenza-Unspecified 01/22/2015, 01/08/2016, 11/08/2019, 09/23/2022   PFIZER(Purple Top)SARS-COV-2 Vaccination 02/20/2019, 03/12/2019, 10/09/2019, 11/22/2019   PNEUMOCOCCAL CONJUGATE-20 12/23/2021   Pfizer Covid-19 Vaccine Bivalent Booster 54yrs & up 11/09/2020, 12/14/2021   Pfizer(Comirnaty)Fall Seasonal Vaccine 12 years and older 12/15/2021, 07/06/2022, 11/07/2022, 05/22/2023   Pneumococcal Conjugate-13 11/01/2018   Td 02/08/1996   Td (Adult),5 Lf Tetanus Toxid, Preservative Free 08/03/2022    Tdap 11/25/2022    Health Maintenance  Topic Date Due   Zoster Vaccines- Shingrix (1 of 2) Never done   Medicare Annual Wellness (AWV)  10/03/2023   INFLUENZA VACCINE  09/08/2023   MAMMOGRAM  03/23/2024   DTaP/Tdap/Td (4 - Td or Tdap) 11/24/2032   Pneumococcal Vaccine: 50+ Years  Completed   DEXA SCAN  Completed   COVID-19 Vaccine  Completed   HPV VACCINES  Aged Out   Meningococcal B Vaccine  Aged Out    Discussed health benefits of physical activity, and encouraged her to engage in regular exercise appropriate for her  age and condition.  Routine general medical examination at a health care facility -     Lipid panel; Future  Age-related osteoporosis without current pathological fracture -     VITAMIN D  25 Hydroxy (Vit-D Deficiency, Fractures); Future  Primary hypertension -     CBC with Differential/Platelet; Future -     Comprehensive metabolic panel with GFR; Future -     Telmisartan ; Take 1 tablet (40 mg total) by mouth daily.  Dispense: 90 tablet; Refill: 1  Diabetes mellitus screening -     Hemoglobin A1c; Future   Normal physical exam findings, the slow HR appears to be chronic. I counseled the patient on the recommended amount of exercise per CDC recommendation, healthy sleep habits and healthy nutrition. I reviewed preventative screening, immunizations, and medical history and updated in the chart, and appropriate labs and vaccinations were ordered. Handouts given on healthy eating and exercise.   Return in 6 months (on 04/05/2024).     Heron CHRISTELLA Sharper, MD

## 2023-10-04 NOTE — Progress Notes (Deleted)
 Subjective:   Sydney Lucas is a 83 y.o. female who presents for Medicare Annual (Subsequent) preventive examination.  Visit Complete: {VISITMETHODVS:[id number]}  Patient Medicare AWV questionnaire was completed by the patient on ***; I have confirmed that all information answered by patient is correct and no changes since this date.        Objective:    Today's Vitals   10/04/23 1056 10/04/23 1101  BP: (!) 96/58   Pulse: (!) 38 (!) 40  Temp: 97.9 F (36.6 C)   TempSrc: Oral   SpO2: 97%   Weight: 124 lb 6.4 oz (56.4 kg)   Height: 5' 0.75 (1.543 m)    Body mass index is 23.7 kg/m.     10/03/2022    1:24 PM 09/29/2021    9:35 AM 07/28/2020    1:10 PM 09/01/2016   11:05 AM  Advanced Directives  Does Patient Have a Medical Advance Directive? Yes Yes Yes Yes   Type of Estate agent of [place];Living will Healthcare Power of [place];Living will Healthcare Power of [place];Living will   Does patient want to make changes to medical advance directive?  No - Patient declined  No - Patient declined   Copy of Healthcare Power of Attorney in Chart? No - copy requested No - copy requested No - copy requested      Data saved with a previous flowsheet row definition     Current Medications (verified) Outpatient Encounter Medications as of 10/04/2023  Medication Sig  . alendronate  (FOSAMAX ) 70 MG tablet TAKE 1 TABLET BY MOUTH WEEKLY  WITH 8 OZ OF PLAIN WATER 30  MINUTES BEFORE FIRST FOOD, DRINK OR MEDS. STAY UPRIGHT FOR 30  MINS  . amLODipine  (NORVASC ) 5 MG tablet TAKE 1 TABLET(5 MG) BY MOUTH DAILY  . apixaban  (ELIQUIS ) 2.5 MG TABS tablet Take 1 tablet (2.5 mg total) by mouth 2 (two) times daily.  . Calcium Carbonate-Vit D-Min (CALTRATE 600+D PLUS) 600-400 MG-UNIT per tablet Take 1 tablet by mouth daily.  . cloNIDine  (CATAPRES ) 0.1 MG tablet Take 1 tablet every 8 hours as needed for systolic BP greater than 180.  . influenza vaccine adjuvanted  (FLUAD) 0.5 ML injection Inject 0.5 mLs into the muscle.  . meloxicam  (MOBIC ) 15 MG tablet TAKE 1 TABLET BY MOUTH DAILY  . Multiple Vitamin (MULTIVITAMIN) tablet Take 1 tablet by mouth daily.  . PARoxetine  (PAXIL ) 10 MG tablet TAKE 1 TABLET BY MOUTH DAILY  . telmisartan  (MICARDIS ) 40 MG tablet TAKE 1 TABLET(40 MG) BY MOUTH DAILY   No facility-administered encounter medications on file as of 10/04/2023.    Allergies (verified) Imipramine, Bactrim  [sulfamethoxazole -trimethoprim ], Penicillins, and Tramadol    History: Past Medical History:  Diagnosis Date  . Anxiety   . Headache(784.0)   . HTN (hypertension)   . Infected finger   . Palpitations    No past surgical history on file. Family History  Problem Relation Age of Onset  . Diabetes Father   . CAD Father    Social History   Socioeconomic History  . Marital status: Widowed    Spouse name: Not on file  . Number of children: Not on file  . Years of education: Not on file  . Highest education level: Master's degree (e.g., MA, MS, MEng, MEd, MSW, MBA)  Occupational History  . Not on file  Tobacco Use  . Smoking status: Former    Current packs/day: 0.00    Types: Cigarettes    Quit date: 02/08/1968  Years since quitting: 55.6  . Smokeless tobacco: Never  Substance and Sexual Activity  . Alcohol use: No  . Drug use: No  . Sexual activity: Not on file  Other Topics Concern  . Not on file  Social History Narrative  . Not on file   Social Drivers of Health   Financial Resource Strain: Medium Risk (10/02/2023)   Overall Financial Resource Strain (CARDIA)   . Difficulty of Paying Living Expenses: Somewhat hard  Food Insecurity: No Food Insecurity (10/02/2023)   Hunger Vital Sign   . Worried About Programme researcher, broadcasting/film/video in the Last Year: Never true   . Ran Out of Food in the Last Year: Never true  Transportation Needs: No Transportation Needs (10/02/2023)   PRAPARE - Transportation   . Lack of Transportation (Medical): No    . Lack of Transportation (Non-Medical): No  Physical Activity: Sufficiently Active (10/02/2023)   Exercise Vital Sign   . Days of Exercise per Week: 7 days   . Minutes of Exercise per Session: 50 min  Stress: Stress Concern Present (10/02/2023)   [employer] of Occupational Health - Occupational Stress Questionnaire   . Feeling of Stress: To some extent  Social Connections: Moderately Integrated (10/02/2023)   Social Connection and Isolation Panel   . Frequency of Communication with Friends and Family: More than three times a week   . Frequency of Social Gatherings with Friends and Family: Once a week   . Attends Religious Services: More than 4 times per year   . Active Member of Clubs or Organizations: Yes   . Attends Banker Meetings: More than 4 times per year   . Marital Status: Widowed    Tobacco Counseling Counseling given: Not Answered   Clinical Intake:                        Activities of Daily Living     No data to display           Patient Care Team: Ozell Heron HERO, MD as PCP - General (Family Medicine)  Indicate any recent Medical Services you may have received from other than [facility] providers in the past year (date may be approximate).     Assessment:   This is a routine wellness examination for Zayana.  Hearing/Vision screen Hearing Screening   500Hz  1000Hz  2000Hz   Right ear Pass Pass Pass  Left ear Pass Pass Pass     Goals Addressed   None    Depression Screen    10/04/2023   10:52 AM 10/03/2022    1:21 PM 01/14/2022    2:10 PM 12/23/2021    2:27 PM 09/29/2021    9:31 AM 03/12/2021    2:35 PM 07/28/2020    1:11 PM  PHQ 2/9 Scores  PHQ - 2 Score 0 0 0 0 0 0 0  PHQ- 9 Score 1  1 0  1     Fall Risk    10/04/2023   10:51 AM 01/25/2023    9:55 AM 10/02/2022    2:14 PM 07/08/2022   11:52 AM 01/14/2022    2:10 PM  Fall Risk   Falls in the past year? 1 1 1 1  0  Number falls in past yr: 0 0 0 0 0  Injury with  Fall? 0 0 0 0 0  Risk for fall due to : Impaired balance/gait Impaired balance/gait   No Fall Risks  Follow up Falls evaluation  completed Falls evaluation completed   Falls evaluation completed      Data saved with a previous flowsheet row definition     MEDICARE RISK AT HOME:    TIMED UP AND GO:  Was the test performed?  {AMBTIMEDUPGO:[phone]}    Cognitive Function:        10/03/2022    1:24 PM 09/29/2021    9:35 AM  6CIT Screen  What Year? 0 points 0 points  What month? 0 points 0 points  What time? 0 points 0 points  Count back from 20 0 points 0 points  Months in reverse 0 points 0 points  Repeat phrase 0 points 0 points  Total Score 0 points 0 points    Immunizations Immunization History  Administered Date(s) Administered  . Fluad Quad(high Dose 65+) 11/01/2018, 12/08/2020, 12/07/2021  . INFLUENZA, HIGH DOSE SEASONAL PF 11/24/2016, 11/09/2017  . Influenza-Unspecified 01/22/2015, 01/08/2016, 11/08/2019, 09/23/2022  . PFIZER(Purple Top)SARS-COV-2 Vaccination 02/20/2019, 03/12/2019, 10/09/2019, 11/22/2019  . PNEUMOCOCCAL CONJUGATE-20 12/23/2021  . Pfizer Covid-19 Vaccine Bivalent Booster 67yrs & up 11/09/2020, 12/14/2021  . Pfizer(Comirnaty)Fall Seasonal Vaccine 12 years and older 12/15/2021, 07/06/2022, 11/07/2022, 05/22/2023  . Pneumococcal Conjugate-13 11/01/2018  . Td 02/08/1996  . Tdap 11/25/2022    {TDAP status:2101805}  {Flu Vaccine status:2101806}  {Pneumococcal vaccine status:2101807}  {Covid-19 vaccine status:2101808}  Qualifies for Shingles Vaccine? {YES/NO:21197}  Zostavax completed {YES/NO:21197}  {Shingrix Completed?:2101804}  Screening Tests Health Maintenance  Topic Date Due  . Zoster Vaccines- Shingrix (1 of 2) Never done  . INFLUENZA VACCINE  09/08/2023  . MAMMOGRAM  03/23/2024  . Medicare Annual Wellness (AWV)  10/03/2024  . DTaP/Tdap/Td (3 - Td or Tdap) 11/24/2032  . Pneumococcal Vaccine: 50+ Years  Completed  . DEXA SCAN   Completed  . COVID-19 Vaccine  Completed  . HPV VACCINES  Aged Out  . Meningococcal B Vaccine  Aged Out    Health Maintenance  Health Maintenance Due  Topic Date Due  . Zoster Vaccines- Shingrix (1 of 2) Never done  . INFLUENZA VACCINE  09/08/2023    {Colorectal cancer screening:2101809}  {Mammogram status:21018020}  {Bone Density status:21018021}  Lung Cancer Screening: (Low Dose CT Chest recommended if Age 78-80 years, 20 pack-year currently smoking OR have quit w/in 15years.) {DOES NOT does:27190::does not} qualify.   Lung Cancer Screening Referral: ***  Additional Screening:  Hepatitis C Screening: {DOES NOT does:27190::does not} qualify; Completed ***  Vision Screening: Recommended annual ophthalmology exams for early detection of glaucoma and other disorders of the eye. Is the patient up to date with their annual eye exam?  {YES/NO:21197} Who is the provider or what is the name of the office in which the patient attends annual eye exams? *** If pt is not established with a provider, would they like to be referred to a provider to establish care? {YES/NO:21197}.   Dental Screening: Recommended annual dental exams for proper oral hygiene  Diabetic Foot Exam: {Diabetic Foot Exam:2101802}  Community Resource Referral / Chronic Care Management: CRR required this visit?  {YES/NO:21197}  CCM required this visit?  {CCM Required choices:[phone]}     Plan:     I have personally reviewed and noted the following in the patient's chart:   Medical and social history Use of alcohol, tobacco or illicit drugs  Current medications and supplements including opioid prescriptions. {Opioid [phone]} Functional ability and status Nutritional status Physical activity Advanced directives List of other physicians Hospitalizations, surgeries, and ER visits in previous 12 months Vitals Screenings to include cognitive,  depression, and falls Referrals and  appointments  In addition, I have reviewed and discussed with patient certain preventive protocols, quality metrics, and best practice recommendations. A written personalized care plan for preventive services as well as general preventive health recommendations were provided to patient.     Heron CHRISTELLA Sharper, MD   10/04/2023   After Visit Summary: {CHL AMB AWV After Visit Summary:[id number]}  Nurse Notes: ***

## 2023-10-06 ENCOUNTER — Ambulatory Visit: Payer: Self-pay | Admitting: Family Medicine

## 2023-10-06 ENCOUNTER — Ambulatory Visit: Payer: Medicare Other

## 2023-10-06 VITALS — Ht 60.0 in | Wt 124.0 lb

## 2023-10-06 DIAGNOSIS — Z0000 Encounter for general adult medical examination without abnormal findings: Secondary | ICD-10-CM | POA: Diagnosis not present

## 2023-10-06 NOTE — Progress Notes (Signed)
 Subjective:   Sydney Lucas is a 83 y.o. who presents for a Medicare Wellness preventive visit.  As a reminder, Annual Wellness Visits don't include a physical exam, and some assessments may be limited, especially if this visit is performed virtually. We may recommend an in-person follow-up visit with your provider if needed.  Visit Complete: Virtual I connected with  Sydney Lucas on 10/06/23 by a audio enabled telemedicine application and verified that I am speaking with the correct person using two identifiers.  Patient Location: Home  Provider Location: Home Office  I discussed the limitations of evaluation and management by telemedicine. The patient expressed understanding and agreed to proceed.  Vital Signs: Because this visit was a virtual/telehealth visit, some criteria may be missing or patient reported. Any vitals not documented were not able to be obtained and vitals that have been documented are patient reported.    Persons Participating in Visit: Patient.  AWV Questionnaire: Yes: Patient Medicare AWV questionnaire was completed by the patient on 10/05/23; I have confirmed that all information answered by patient is correct and no changes since this date.  Cardiac Risk Factors include: advanced age (>32men, >43 women);hypertension     Objective:    Today's Vitals   10/06/23 1303  Weight: 124 lb (56.2 kg)  Height: 5' (1.524 m)   Body mass index is 24.22 kg/m.     10/06/2023    1:12 PM 10/03/2022    1:24 PM 09/29/2021    9:35 AM 07/28/2020    1:10 PM 09/01/2016   11:05 AM  Advanced Directives  Does Patient Have a Medical Advance Directive? Yes Yes Yes Yes Yes   Type of Estate agent of [place];Living will Healthcare Power of [place];Living will Healthcare Power of [place];Living will Healthcare Power of [place];Living will   Does patient want to make changes to medical advance directive? No - Patient declined  No -  Patient declined  No - Patient declined   Copy of Healthcare Power of Attorney in Chart? Yes - validated most recent copy scanned in chart (See row information) No - copy requested No - copy requested No - copy requested      Data saved with a previous flowsheet row definition    Current Medications (verified) Outpatient Encounter Medications as of 10/06/2023  Medication Sig   alendronate  (FOSAMAX ) 70 MG tablet TAKE 1 TABLET BY MOUTH WEEKLY  WITH 8 OZ OF PLAIN WATER 30  MINUTES BEFORE FIRST FOOD, DRINK OR MEDS. STAY UPRIGHT FOR 30  MINS   amLODipine  (NORVASC ) 5 MG tablet TAKE 1 TABLET(5 MG) BY MOUTH DAILY   apixaban  (ELIQUIS ) 2.5 MG TABS tablet Take 1 tablet (2.5 mg total) by mouth 2 (two) times daily.   Calcium Carbonate-Vit D-Min (CALTRATE 600+D PLUS) 600-400 MG-UNIT per tablet Take 1 tablet by mouth daily.   cloNIDine  (CATAPRES ) 0.1 MG tablet Take 1 tablet every 8 hours as needed for systolic BP greater than 180.   influenza vaccine adjuvanted (FLUAD) 0.5 ML injection Inject 0.5 mLs into the muscle.   meloxicam  (MOBIC ) 15 MG tablet TAKE 1 TABLET BY MOUTH DAILY   Multiple Vitamin (MULTIVITAMIN) tablet Take 1 tablet by mouth daily.   PARoxetine  (PAXIL ) 10 MG tablet TAKE 1 TABLET BY MOUTH DAILY   telmisartan  (MICARDIS ) 40 MG tablet Take 1 tablet (40 mg total) by mouth daily.   No facility-administered encounter medications on file as of 10/06/2023.    Allergies (verified) Imipramine, Bactrim  [sulfamethoxazole -trimethoprim ], Penicillins, and Tramadol   History: Past Medical History:  Diagnosis Date   Anxiety    Headache(784.0)    HTN (hypertension)    Infected finger    Palpitations    History reviewed. No pertinent surgical history. Family History  Problem Relation Age of Onset   Diabetes Father    CAD Father    Social History   Socioeconomic History   Marital status: Widowed    Spouse name: Not on file   Number of children: Not on file   Years of education: Not on file    Highest education level: Master's degree (e.g., MA, MS, MEng, MEd, MSW, MBA)  Occupational History   Not on file  Tobacco Use   Smoking status: Former    Current packs/day: 0.00    Types: Cigarettes    Quit date: 02/08/1968    Years since quitting: 55.6   Smokeless tobacco: Never  Substance and Sexual Activity   Alcohol use: No   Drug use: No   Sexual activity: Not on file  Other Topics Concern   Not on file  Social History Narrative   Not on file   Social Drivers of Health   Financial Resource Strain: Medium Risk (10/06/2023)   Overall Financial Resource Strain (CARDIA)    Difficulty of Paying Living Expenses: Somewhat hard  Food Insecurity: No Food Insecurity (10/06/2023)   Hunger Vital Sign    Worried About Running Out of Food in the Last Year: Never true    Ran Out of Food in the Last Year: Never true  Transportation Needs: No Transportation Needs (10/06/2023)   PRAPARE - Administrator, Civil Service (Medical): No    Lack of Transportation (Non-Medical): No  Physical Activity: Sufficiently Active (10/06/2023)   Exercise Vital Sign    Days of Exercise per Week: 7 days    Minutes of Exercise per Session: 50 min  Stress: Stress Concern Present (10/06/2023)   [employer] of Occupational Health - Occupational Stress Questionnaire    Feeling of Stress: To some extent  Social Connections: Moderately Integrated (10/06/2023)   Social Connection and Isolation Panel    Frequency of Communication with Friends and Family: More than three times a week    Frequency of Social Gatherings with Friends and Family: Once a week    Attends Religious Services: More than 4 times per year    Active Member of [employer] or Organizations: Yes    Attends Banker Meetings: More than 4 times per year    Marital Status: Widowed    Tobacco Counseling Counseling given: Not Answered    Clinical Intake:  Pre-visit preparation completed: Yes  Pain : No/denies pain      BMI - recorded: 24.22 Nutritional Status: BMI of 19-24  Normal Nutritional Risks: None Diabetes: No  Lab Results  Component Value Date   HGBA1C 6.3 10/04/2023   HGBA1C 6.1 07/13/2022   HGBA1C 6.1 06/03/2021     How often do you need to have someone help you when you read instructions, pamphlets, or other written materials from your doctor or pharmacy?: 1 - Never  Interpreter Needed?: No  Information entered by :: Rojelio Blush LPN   Activities of Daily Living      10/06/2023    1:10 PM 10/05/2023    2:28 PM  In your present state of health, do you have any difficulty performing the following activities:  Hearing? 0 0  Vision? 0 0  Difficulty concentrating or making decisions? 0 0  Walking  or climbing stairs? 0 0  Dressing or bathing? 0 0  Doing errands, shopping? 0 0  Preparing Food and eating ? N N  Using the Toilet? N N  In the past six months, have you accidently leaked urine? Sydney Lucas Sydney Lucas  Comment Wears Breifs. Followed by PCP   Do you have problems with loss of bowel control? N N  Managing your Medications? N N  Managing your Finances? N N  Housekeeping or managing your Housekeeping? N N    Patient Care Team: Ozell Heron HERO, MD as PCP - General (Family Medicine)   I have updated your Care Teams any recent Medical Services you may have received from other providers in the past year.     Assessment:   This is a routine wellness examination for Sydney Lucas.  Hearing/Vision screen Hearing Screening - Comments:: Patient has hearing difficulties. Pending Hearing Aids   Vision Screening - Comments:: Wears rx glasses - up to date with [facility] exams with  [facility]   Goals Addressed               This Visit's Progress     Increase physical activity (pt-stated)        Remain active.       Depression Screen      10/06/2023    1:13 PM 10/04/2023   10:52 AM 10/03/2022    1:21 PM 01/14/2022    2:10 PM 12/23/2021    2:27 PM 09/29/2021    9:31 AM  03/12/2021    2:35 PM  PHQ 2/9 Scores  PHQ - 2 Score 0 0 0 0 0 0 0  PHQ- 9 Score 0 1  1 0  1    Fall Risk      10/06/2023    1:11 PM 10/05/2023    2:28 PM 10/04/2023   10:51 AM 01/25/2023    9:55 AM 10/02/2022    2:14 PM  Fall Risk   Falls in the past year? 1 1 1 1 1   Number falls in past yr: 0 0 0 0 0  Injury with Fall? 0 0 0 0 0  Risk for fall due to : No Fall Risks  Impaired balance/gait Impaired balance/gait   Follow up Falls evaluation completed  Falls evaluation completed Falls evaluation completed     MEDICARE RISK AT HOME:   Medicare Risk at Home Any stairs in or around the home?: No If so, are there any without handrails?: Yes Home free of loose throw rugs in walkways, pet beds, electrical cords, etc?: Yes Adequate lighting in your home to reduce risk of falls?: Yes Life alert?: No Use of a cane, walker or w/c?: No Grab bars in the bathroom?: No Shower chair or bench in shower?: Yes Elevated toilet seat or a handicapped toilet?: No  TIMED UP AND GO:  Was the test performed?  No  Cognitive Function: 6CIT completed        10/06/2023    1:12 PM 10/03/2022    1:24 PM 09/29/2021    9:35 AM  6CIT Screen  What Year? 0 points 0 points 0 points  What month? 0 points 0 points 0 points  What time? 0 points 0 points 0 points  Count back from 20 0 points 0 points 0 points  Months in reverse 0 points 0 points 0 points  Repeat phrase 0 points 0 points 0 points  Total Score 0 points 0 points 0 points    Immunizations Immunization  History  Administered Date(s) Administered    sv, Bivalent, Protein Subunit Rsvpref,pf (Abrysvo) 11/29/2022   Fluad Quad(high Dose 65+) 11/01/2018, 12/08/2020, 12/07/2021   Fluad Trivalent(High Dose 65+) 09/23/2022   INFLUENZA, HIGH DOSE SEASONAL PF 11/24/2016, 11/09/2017   Influenza-Unspecified 01/22/2015, 01/08/2016, 11/08/2019, 09/23/2022   PFIZER(Purple Top)SARS-COV-2 Vaccination 02/20/2019, 03/12/2019, 10/09/2019, 11/22/2019    PNEUMOCOCCAL CONJUGATE-20 12/23/2021   Pfizer Covid-19 Vaccine Bivalent Booster 24yrs & up 11/09/2020, 12/14/2021   Pfizer(Comirnaty)Fall Seasonal Vaccine 12 years and older 12/15/2021, 07/06/2022, 11/07/2022, 05/22/2023   Pneumococcal Conjugate-13 11/01/2018   Td 02/08/1996   Td (Adult),5 Lf Tetanus Toxid, Preservative Free 08/03/2022   Tdap 11/25/2022    Screening Tests Health Maintenance  Topic Date Due   Zoster Vaccines- Shingrix (1 of 2) Never done   INFLUENZA VACCINE  09/08/2023   MAMMOGRAM  03/23/2024   Medicare Annual Wellness (AWV)  10/05/2024   DTaP/Tdap/Td (4 - Td or Tdap) 11/24/2032   Pneumococcal Vaccine: 50+ Years  Completed   DEXA SCAN  Completed   COVID-19 Vaccine  Completed   HPV VACCINES  Aged Out   Meningococcal B Vaccine  Aged Out    Health Maintenance  Health Maintenance Due  Topic Date Due   Zoster Vaccines- Shingrix (1 of 2) Never done   INFLUENZA VACCINE  09/08/2023   Health Maintenance Items Addressed:   Additional Screening:  Vision Screening: Recommended annual ophthalmology exams for early detection of glaucoma and other disorders of the eye. Would you like a referral to an eye doctor? No    Dental Screening: Recommended annual dental exams for proper oral hygiene  Community Resource Referral / Chronic Care Management: CRR required this visit?  No   CCM required this visit?  No   Plan:    I have personally reviewed and noted the following in the patient's chart:   Medical and social history Use of alcohol, tobacco or illicit drugs  Current medications and supplements including opioid prescriptions. Patient is not currently taking opioid prescriptions. Functional ability and status Nutritional status Physical activity Advanced directives List of other physicians Hospitalizations, surgeries, and ER visits in previous 12 months Vitals Screenings to include cognitive, depression, and falls Referrals and appointments  In  addition, I have reviewed and discussed with patient certain preventive protocols, quality metrics, and best practice recommendations. A written personalized care plan for preventive services as well as general preventive health recommendations were provided to patient.   Rojelio LELON Blush, LPN   1/70/7974   After Visit Summary: (MyChart) Due to this being a telephonic visit, the after visit summary with patients personalized plan was offered to patient via MyChart   Notes: Nothing significant to report at this time.

## 2023-10-06 NOTE — Patient Instructions (Signed)
 Ms. Hamid , Thank you for taking time out of your busy schedule to complete your Annual Wellness Visit with me. I enjoyed our conversation and look forward to speaking with you again next year. I, as well as your care team,  appreciate your ongoing commitment to your health goals. Please review the following plan we discussed and let me know if I can assist you in the future. Your Game plan/ To Do List    Referrals: If you haven't heard from the office you've been referred to, please reach out to them at the phone provided.   Follow up Visits: We will see or speak with you next year for your Next Medicare AWV with our clinical staff 10/11/24 @ 1p Have you seen your provider in the last 6 months (3 months if uncontrolled diabetes)?   Clinician Recommendations:  Aim for 30 minutes of exercise or brisk walking, 6-8 glasses of water, and 5 servings of fruits and vegetables each day.        This is a list of the screenings recommended for you:  Health Maintenance  Topic Date Due   Zoster (Shingles) Vaccine (1 of 2) Never done   Flu Shot  09/08/2023   Mammogram  03/23/2024   Medicare Annual Wellness Visit  10/05/2024   DTaP/Tdap/Td vaccine (4 - Td or Tdap) 11/24/2032   Pneumococcal Vaccine for age over 47  Completed   DEXA scan (bone density measurement)  Completed   COVID-19 Vaccine  Completed   HPV Vaccine  Aged Out   Meningitis B Vaccine  Aged Out    Advanced directives: (In Chart) A copy of your advanced directives are scanned into your chart should your provider ever need it. Advance Care Planning is important because it:  [x]  Makes sure you receive the medical care that is consistent with your values, goals, and preferences  [x]  It provides guidance to your family and loved ones and reduces their decisional burden about whether or not they are making the right decisions based on your wishes.  Follow the link provided in your after visit summary or read over the paperwork  we have mailed to you to help you started getting your Advance Directives in place. If you need assistance in completing these, please reach out to us  so that we can help you!  See attachments for Preventive Care and Fall Prevention Tips.

## 2023-10-09 ENCOUNTER — Encounter: Payer: Self-pay | Admitting: Family Medicine

## 2023-10-10 ENCOUNTER — Encounter: Payer: Self-pay | Admitting: Family Medicine

## 2023-10-11 ENCOUNTER — Ambulatory Visit: Attending: Cardiovascular Disease

## 2023-10-11 DIAGNOSIS — R001 Bradycardia, unspecified: Secondary | ICD-10-CM

## 2023-10-11 NOTE — Progress Notes (Unsigned)
 Enrolled for [employer] to mail a ZIO XT long term holter monitor to the patients address on file.

## 2023-10-13 ENCOUNTER — Other Ambulatory Visit: Payer: Self-pay | Admitting: Family Medicine

## 2023-10-13 DIAGNOSIS — I10 Essential (primary) hypertension: Secondary | ICD-10-CM

## 2023-10-13 NOTE — Telephone Encounter (Signed)
 Sent on 8/27, did they not receive the script? If not then ok to send

## 2023-10-17 ENCOUNTER — Encounter: Payer: Self-pay | Admitting: Family Medicine

## 2023-10-17 DIAGNOSIS — Z23 Encounter for immunization: Secondary | ICD-10-CM

## 2023-10-17 MED ORDER — COVID-19 MRNA VACC (MODERNA) 50 MCG/0.5ML IM SUSP: 0.5000 mL | Freq: Once | INTRAMUSCULAR | 0 refills | Status: AC

## 2023-10-24 DIAGNOSIS — R001 Bradycardia, unspecified: Secondary | ICD-10-CM | POA: Diagnosis not present

## 2023-10-31 DIAGNOSIS — R001 Bradycardia, unspecified: Secondary | ICD-10-CM | POA: Diagnosis not present

## 2023-11-06 ENCOUNTER — Other Ambulatory Visit: Payer: Self-pay

## 2023-11-06 ENCOUNTER — Ambulatory Visit
Admission: RE | Admit: 2023-11-06 | Discharge: 2023-11-06 | Disposition: A | Discharge status: Home / Self Care | Attending: Physician Assistant | Admitting: Physician Assistant

## 2023-11-06 ENCOUNTER — Telehealth: Payer: Self-pay

## 2023-11-06 ENCOUNTER — Encounter: Payer: Self-pay | Admitting: Cardiovascular Disease

## 2023-11-06 VITALS — BP 137/74 | HR 69 | Temp 98.1°F | Resp 17 | Ht 60.0 in | Wt 123.9 lb

## 2023-11-06 DIAGNOSIS — R001 Bradycardia, unspecified: Secondary | ICD-10-CM

## 2023-11-06 DIAGNOSIS — R519 Headache, unspecified: Secondary | ICD-10-CM | POA: Diagnosis not present

## 2023-11-06 NOTE — ED Triage Notes (Signed)
 Pt presents with a chief complaint of headache x 1 week. States the pain is constant. Currently rates head pain a 4/10. OTC Tylenol  taken with minimal relief/improvement at home. Did mention pain in-between her shoulder blades however this has now resolved. Hx of atrial flutter. HR was in the upper 30's yesterday per pt. HR is 69bpm in triage.

## 2023-11-06 NOTE — ED Notes (Addendum)
 EKG obtained and handed off to [place] PA.

## 2023-11-06 NOTE — Discharge Instructions (Addendum)
 VISIT SUMMARY:  You came in today with a persistent headache and concerns about your slow heart rate (bradycardia). Your headache has been ongoing for about a week, and you have been taking Tylenol  Arthritis Strength without much relief. You also mentioned a significant drop in your pulse rate to 37 bpm, but you are currently under the care of a cardiologist and awaiting results from a heart monitor test.  YOUR PLAN:  -HEADACHE: A headache is a pain in your head that can vary in intensity. Your headache has been persistent for about a week, and you are currently taking Tylenol  Arthritis Strength every 8 hours. You should continue this medication and try applying warm or cool compresses to your head. We will also order blood work to check your electrolytes and blood count. If your headaches do not improve, please follow up with your primary care doctor in one week. If your headaches worsen or you experience vision changes, dizziness, numbness or weakness on one side of your body, or facial drooping, go to the emergency room immediately.  -BRADYCARDIA: Bradycardia is a slower than normal heart rate. Your pulse dropped to 37 bpm, but you are under the care of a cardiologist and have recently undergone a heart monitor test. Your EKG in the office was normal. Please await the results of your heart monitor test. If you experience symptoms like chest pain, sweating, or pain radiating to your arm or jaw, go to the emergency room immediately.  INSTRUCTIONS:  Please follow up with your primary care doctor in one week if your headaches do not improve. Await the results of your recent heart monitor test. Go to the emergency room if you experience worsening headaches with vision changes, dizziness, numbness or weakness on one side of your body, or facial drooping, or if you have chest pain, sweating, or pain radiating to your arm or jaw.

## 2023-11-06 NOTE — Telephone Encounter (Signed)
 This RN attempted to call patient x2, no answer. Called home phone + mobile phone on file. Did leave a voicemail on both. Called due to patient making an appointment for 4 PM today. Hx of cardiac issues. Currently reporting headache, pain in-between the shoulder blades, and a HR in the 30's.   Spoke with Redell, pt's son. Did answer. Will attempt to get in contact with mother.   1315: Pt did call back. Advised to come to urgent care now for immediate evaluation. Did explain the limited resources we have available here at [facility]. States she is on her way. Provider in office made aware.

## 2023-11-06 NOTE — ED Provider Notes (Signed)
 [facility]    CSN: [id number] Arrival date & time: 11/06/23  1341      History   Chief Complaint Chief Complaint  Patient presents with   Headache    I have had a headache and a pain between my shoulder blades for a week. I have atrial flutter, and I'm concerned that it may have something to do with my heart. - Entered by patient    HPI Sydney Lucas is a 83 y.o. female.  has a past medical history of Anxiety, Headache(784.0), HTN (hypertension), Infected finger, and Palpitations.   HPI  Discussed the use of AI scribe software for clinical note transcription with the patient, who gave verbal consent to proceed.  The patient, with a history of a heart problem, presents with a persistent headache and concerns about bradycardia.  She has been experiencing a headache for about a week, located behind the eyes, with intensity fluctuating between 4 and 8 out of 10. The headache is described as 'pretty unbearable at times' and improves and worsens intermittently. She is taking Tylenol  Arthritis Strength every eight hours, but it does not provide significant relief. No vision changes, dizziness, nausea, vomiting, or sensitivity to light are reported, but she wants to lie down and keep her eyes closed when the headache is severe.  She is concerned about her heart condition, noting a pulse drop to 37 bpm the previous morning, which is a known issue. She is awaiting results from a heart monitor test. No chest pain, shortness of breath, or leg swelling is reported, but she experiences occasional palpitations. She is on Eliquis , which limits her options for headache medication.  During the review of symptoms, she denies fever, chills, sinus pressure, congestion, neck pain, stiffness, body aches, or difficulty speaking. She mentions a transient pain between her shoulder blades a couple of days ago, which has since resolved. She also denies numbness, tingling, weakness on one  side of the body, facial drooping, or loss of consciousness.   Past Medical History:  Diagnosis Date   Anxiety    Headache(784.0)    HTN (hypertension)    Infected finger    Palpitations     Patient Active Problem List   Diagnosis Date Noted   HTN (hypertension) 02/08/2022   Anxiety 12/24/2021   Onychomycosis 12/24/2021   Osteoporosis 12/24/2021   History of colonic polyps 08/02/2011   Paroxysmal atrial fibrillation (HCC) 04/15/2011    History reviewed. No pertinent surgical history.  OB History   No obstetric history on file.      Home Medications    Prior to Admission medications   Medication Sig Start Date End Date Taking? Authorizing Provider  alendronate  (FOSAMAX ) 70 MG tablet TAKE 1 TABLET BY MOUTH WEEKLY  WITH 8 OZ OF PLAIN WATER 30  MINUTES BEFORE FIRST FOOD, DRINK OR MEDS. STAY UPRIGHT FOR 30  MINS 03/31/23   Ozell Heron HERO, MD  amLODipine  (NORVASC ) 5 MG tablet TAKE 1 TABLET(5 MG) BY MOUTH DAILY 08/15/23   Ozell Heron HERO, MD  apixaban  (ELIQUIS ) 2.5 MG TABS tablet Take 1 tablet (2.5 mg total) by mouth 2 (two) times daily. 08/14/23   Jeffrie Oneil BROCKS, MD  Calcium Carbonate-Vit D-Min (CALTRATE 600+D PLUS) 600-400 MG-UNIT per tablet Take 1 tablet by mouth daily.    [provider]  cloNIDine  (CATAPRES ) 0.1 MG tablet Take 1 tablet every 8 hours as needed for systolic BP greater than 180. 87/81/75   Ozell Heron HERO, MD  influenza vaccine  adjuvanted (FLUAD) 0.5 ML injection Inject 0.5 mLs into the muscle. 12/07/21   Luiz Channel, MD  meloxicam  (MOBIC ) 15 MG tablet TAKE 1 TABLET BY MOUTH DAILY 07/31/23   Ozell Heron HERO, MD  Multiple Vitamin (MULTIVITAMIN) tablet Take 1 tablet by mouth daily.    [provider]  PARoxetine  (PAXIL ) 10 MG tablet TAKE 1 TABLET BY MOUTH DAILY 08/21/23   Ozell Heron HERO, MD  telmisartan  (MICARDIS ) 40 MG tablet TAKE 1 TABLET(40 MG) BY MOUTH DAILY 10/13/23   Ozell Heron HERO, MD    Family History Family History   Problem Relation Age of Onset   Diabetes Father    CAD Father     Social History Social History   Tobacco Use   Smoking status: Former    Current packs/day: 0.00    Types: Cigarettes    Quit date: 02/08/1968    Years since quitting: 55.7   Smokeless tobacco: Never  Vaping Use   Vaping status: Never Used  Substance Use Topics   Alcohol use: No   Drug use: No     Allergies   Imipramine, Bactrim  [sulfamethoxazole -trimethoprim ], Penicillins, and Tramadol    Review of Systems Review of Systems  Constitutional:  Negative for chills and fever.  Eyes:  Negative for photophobia and visual disturbance.  Respiratory:  Negative for chest tightness, shortness of breath and wheezing.   Cardiovascular:  Positive for palpitations. Negative for chest pain and leg swelling.  Gastrointestinal:  Negative for diarrhea, nausea and vomiting.  Musculoskeletal:  Positive for myalgias. Negative for neck pain and neck stiffness.  Neurological:  Positive for headaches. Negative for dizziness, syncope, facial asymmetry, speech difficulty, weakness, light-headedness and numbness.     Physical Exam Triage Vital Signs ED Triage Vitals  Encounter Vitals Group     BP 11/06/23 1355 137/74     Girls Systolic BP Percentile --      Girls Diastolic BP Percentile --      Boys Systolic BP Percentile --      Boys Diastolic BP Percentile --      Pulse Rate 11/06/23 1355 69     Resp 11/06/23 1355 17     Temp 11/06/23 1355 98.1 F (36.7 C)     Temp Source 11/06/23 1355 Oral     SpO2 11/06/23 1355 95 %     Weight 11/06/23 1357 123 lb 14.4 oz (56.2 kg)     Height 11/06/23 1357 5' (1.524 m)     Head Circumference --      Peak Flow --      Pain Score 11/06/23 1356 4     Pain Loc --      Pain Education --      Exclude from Growth Chart --    No data found.  Updated Vital Signs BP 137/74 (BP Location: Right Arm)   Pulse 69   Temp 98.1 F (36.7 C) (Oral)   Resp 17   Ht 5' (1.524 m)   Wt 123 lb  14.4 oz (56.2 kg)   SpO2 95%   BMI 24.20 kg/m   Visual Acuity Right Eye Distance:   Left Eye Distance:   Bilateral Distance:    Right Eye Near:   Left Eye Near:    Bilateral Near:     Physical Exam Vitals reviewed.  Constitutional:      General: She is awake.     Appearance: Normal appearance. She is well-developed and well-groomed.  HENT:     Head: Normocephalic and  atraumatic.  Eyes:     General: Lids are normal. Gaze aligned appropriately.     Extraocular Movements: Extraocular movements intact.     Conjunctiva/sclera: Conjunctivae normal.  Cardiovascular:     Rate and Rhythm: Normal rate and regular rhythm.     Pulses: Normal pulses.          Radial pulses are 2+ on the right side and 2+ on the left side.     Heart sounds: Normal heart sounds. No murmur heard.    No friction rub. No gallop.  Pulmonary:     Effort: Pulmonary effort is normal.     Breath sounds: Normal breath sounds. No decreased air movement. No decreased breath sounds, wheezing, rhonchi or rales.  Musculoskeletal:     Right lower leg: No edema.     Left lower leg: No edema.  Skin:    General: Skin is warm and dry.  Neurological:     General: No focal deficit present.     Mental Status: She is alert and oriented to person, place, and time.     Cranial Nerves: No cranial nerve deficit, dysarthria or facial asymmetry.     Motor: No weakness, tremor, atrophy or abnormal muscle tone.     Gait: Gait is intact.  Psychiatric:        Attention and Perception: Attention and perception normal.        Mood and Affect: Mood and affect normal.        Speech: Speech normal.        Behavior: Behavior normal. Behavior is cooperative.        Thought Content: Thought content normal.        Cognition and Memory: Cognition normal.        Judgment: Judgment normal.      UC Treatments / Results  Labs (all labs ordered are listed, but only abnormal results are displayed) Labs Reviewed  COMPREHENSIVE METABOLIC  PANEL WITH GFR  CBC WITH DIFFERENTIAL/PLATELET    EKG   Radiology No results found.  Procedures ED EKG  Date/Time: 11/06/2023 2:11 PM  Performed by: Marylene Rocky BRAVO, PA-C Authorized by: Marylene Rocky BRAVO, PA-C   Previous ECG:    Previous ECG:  Compared to current   Similarity:  Changes noted   Comparison ECG info:  EKG on 09/06/23 demonstrates atrial ectopy. EKG from 06/27/23 demonstrates normal sinus with PACs Interpretation:    Interpretation: normal   Rate:    ECG rate:  69   ECG rate assessment: normal   Rhythm:    Rhythm: sinus rhythm   Ectopy:    Ectopy: none   QRS:    QRS intervals:  Normal   QRS conduction: normal   ST segments:    ST segments:  Normal T waves:    T waves: normal    (including critical care time)  Medications Ordered in UC Medications - No data to display  Initial Impression / Assessment and Plan / UC Course  I have reviewed the triage vital signs and the nursing notes.  Pertinent labs & imaging results that were available during my care of the patient were reviewed by me and considered in my medical decision making (see chart for details).      Final Clinical Impressions(s) / UC Diagnoses   Final diagnoses:  Acute intractable headache, unspecified headache type  Bradycardia   Headache Headache persisting for about a week, located behind the eyes with severity fluctuating between 4 to 8 out  of 10. No associated vision changes, dizziness, or other neurological symptoms. Limited treatment options due to concurrent use of Eliquis .  - Continue Tylenol  arthritis strength every 8 hours - Apply warm or cool compresses to the head - Order blood work for [facility] and CBC to assess for contributing factors. - Advise follow-up with primary care doctor in one week if headaches do not improve - Instruct to go to the emergency room if headaches worsen or are associated with vision changes, dizziness, numbness or weakness on one side of the body, or facial  drooping  Bradycardia Bradycardia with a pulse as low as 37 bpm. Under cardiologist's care with recent heart monitor test. No current symptoms suggestive of acute cardiac issues such as chest pain, shortness of breath, or syncope. Normal EKG in clinic today. Reviewed limitations of UC and need for ongoing monitoring and potential ER visit if symptoms are not improving or worsening - Await results of recent heart monitor test - Advise to go to the emergency room if experiencing symptoms suggestive of cardiac injury such as chest pain, sweating, or pain radiating to the arm or jaw     Discharge Instructions      VISIT SUMMARY:  You came in today with a persistent headache and concerns about your slow heart rate (bradycardia). Your headache has been ongoing for about a week, and you have been taking Tylenol  Arthritis Strength without much relief. You also mentioned a significant drop in your pulse rate to 37 bpm, but you are currently under the care of a cardiologist and awaiting results from a heart monitor test.  YOUR PLAN:  -HEADACHE: A headache is a pain in your head that can vary in intensity. Your headache has been persistent for about a week, and you are currently taking Tylenol  Arthritis Strength every 8 hours. You should continue this medication and try applying warm or cool compresses to your head. We will also order blood work to check your electrolytes and blood count. If your headaches do not improve, please follow up with your primary care doctor in one week. If your headaches worsen or you experience vision changes, dizziness, numbness or weakness on one side of your body, or facial drooping, go to the emergency room immediately.  -BRADYCARDIA: Bradycardia is a slower than normal heart rate. Your pulse dropped to 37 bpm, but you are under the care of a cardiologist and have recently undergone a heart monitor test. Your EKG in the office was normal. Please await the results of your  heart monitor test. If you experience symptoms like chest pain, sweating, or pain radiating to your arm or jaw, go to the emergency room immediately.  INSTRUCTIONS:  Please follow up with your primary care doctor in one week if your headaches do not improve. Await the results of your recent heart monitor test. Go to the emergency room if you experience worsening headaches with vision changes, dizziness, numbness or weakness on one side of your body, or facial drooping, or if you have chest pain, sweating, or pain radiating to your arm or jaw.     ED Prescriptions   None    PDMP not reviewed this encounter.   Marylene Rocky BRAVO, PA-C 11/06/23 1442

## 2023-11-07 ENCOUNTER — Ambulatory Visit (HOSPITAL_COMMUNITY): Payer: Self-pay

## 2023-11-07 LAB — CBC WITH DIFFERENTIAL/PLATELET
Basophils Absolute: 0.1 x10E3/uL (ref 0.0–0.2)
Basos: 1 %
EOS (ABSOLUTE): 0.2 x10E3/uL (ref 0.0–0.4)
Eos: 2 %
Hematocrit: 35.8 % (ref 34.0–46.6)
Hemoglobin: 11.5 g/dL (ref 11.1–15.9)
Immature Grans (Abs): 0 x10E3/uL (ref 0.0–0.1)
Immature Granulocytes: 0 %
Lymphocytes Absolute: 2.2 x10E3/uL (ref 0.7–3.1)
Lymphs: 29 %
MCH: 31.3 pg (ref 26.6–33.0)
MCHC: 32.1 g/dL (ref 31.5–35.7)
MCV: 98 fL — ABNORMAL HIGH (ref 79–97)
Monocytes Absolute: 0.7 x10E3/uL (ref 0.1–0.9)
Monocytes: 9 %
Neutrophils Absolute: 4.5 x10E3/uL (ref 1.4–7.0)
Neutrophils: 59 %
Platelets: 283 x10E3/uL (ref 150–450)
RBC: 3.67 x10E6/uL — ABNORMAL LOW (ref 3.77–5.28)
RDW: 12.5 % (ref 11.7–15.4)
WBC: 7.6 x10E3/uL (ref 3.4–10.8)

## 2023-11-07 LAB — COMPREHENSIVE METABOLIC PANEL WITH GFR
ALT: 13 IU/L (ref 0–32)
AST: 16 IU/L (ref 0–40)
Albumin: 4.3 g/dL (ref 3.7–4.7)
Alkaline Phosphatase: 60 IU/L (ref 48–129)
BUN/Creatinine Ratio: 30 — ABNORMAL HIGH (ref 12–28)
BUN: 33 mg/dL — ABNORMAL HIGH (ref 8–27)
Bilirubin Total: 0.5 mg/dL (ref 0.0–1.2)
CO2: 18 mmol/L — ABNORMAL LOW (ref 20–29)
Calcium: 9.5 mg/dL (ref 8.7–10.3)
Chloride: 100 mmol/L (ref 96–106)
Creatinine, Ser: 1.11 mg/dL — ABNORMAL HIGH (ref 0.57–1.00)
Globulin, Total: 2.7 g/dL (ref 1.5–4.5)
Glucose: 126 mg/dL — ABNORMAL HIGH (ref 70–99)
Potassium: 5.1 mmol/L (ref 3.5–5.2)
Sodium: 133 mmol/L — ABNORMAL LOW (ref 134–144)
Total Protein: 7 g/dL (ref 6.0–8.5)
eGFR: 49 mL/min/1.73 — ABNORMAL LOW (ref >59)

## 2023-11-13 ENCOUNTER — Other Ambulatory Visit: Payer: Self-pay | Admitting: Family Medicine

## 2023-11-13 DIAGNOSIS — I10 Essential (primary) hypertension: Secondary | ICD-10-CM

## 2023-11-20 ENCOUNTER — Ambulatory Visit: Attending: Cardiovascular Disease | Admitting: Cardiovascular Disease

## 2023-11-20 ENCOUNTER — Encounter: Payer: Self-pay | Admitting: Cardiovascular Disease

## 2023-11-20 VITALS — BP 102/60 | HR 74 | Ht 60.0 in | Wt 124.0 lb

## 2023-11-20 DIAGNOSIS — R001 Bradycardia, unspecified: Secondary | ICD-10-CM

## 2023-11-20 DIAGNOSIS — I480 Paroxysmal atrial fibrillation: Secondary | ICD-10-CM

## 2023-11-20 DIAGNOSIS — I4892 Unspecified atrial flutter: Secondary | ICD-10-CM | POA: Diagnosis not present

## 2023-11-20 DIAGNOSIS — R002 Palpitations: Secondary | ICD-10-CM

## 2023-11-20 NOTE — Progress Notes (Signed)
 Electrophysiology Office Note:    Date:  11/20/2023   ID:  Sydney Lucas Dec 28, 1940, MRN [id number]  PCP:  Ozell Heron HERO, MD   [facility] Providers Cardiologist:  None     Referring MD: Ozell Heron HERO, MD   History of Present Illness:    Sydney Lucas is a 83 y.o. female with a medical history significant for hypertension, referred for evaluation and management of palpitations and bradycardia.     She saw Dr. Jeffrie in November 2024 for evaluation of palpitations. at that time she noted intermittent rapid heart rates associated with stress   History of Present Illness Sydney Lucas is an 83 year old female with hypertension who presents with palpitations and bradycardia. She was referred by Dr. Farley for evaluation of palpitations and bradycardia.  She has experienced palpitations and bradycardia for several years, initially during periods of significant stress. Episodes involve a racing heart, initially thought to be panic attacks. In November 2024, intermittent rapid heart rates associated with stress were noted. In May 2025, a monitor showed a 3% burden of premature atrial contractions and a 4% burden of high atrial rates.  Current episodes last about three minutes, whereas previously they could last up to an hour and a half. She sometimes becomes aware of her heart beating fast or out of rhythm at night.  She has not taken medications like metoprolol or carvedilol due to concerns for bradycardia.  She is here today to discuss results from her monitor         Today, she reports that she is well and has no complaints.  EKGs/Labs/Other Studies Reviewed Today:     Echocardiogram:  TTE February 07, 2023 LVEF 60 to 65%.  Normal LV function.  Normal RV systolic function.   Monitors:  14 day monitor May 2025-- my interpretation Sinus rhythm predominates. Sinus heart rate 51 to 103 bpm, average 69 bpm 3%  burden of PACs, less than 1% burden PVCs. Nocturnal pauses occurred. 4% burden of atrial high rates --, narrow complex, regular rhythm with short PR; possible flutter with low amplitude flutter waves.  Some EKGs are consistent with a slow atrial flutter versus atrial tachycardia (eg 07/17/23 2:26:57 AM) Symptoms correlate with atrial and ventricular ectopy and also with SVT  3-day monitor September 2025 Predominant rhythm: Sinus Sinus HR: 58 - 101 bpm, AVG 72 bpm 25.5% atrial ectopy 1.5% ventricular ectopy Arrhythmia detected: Numerous atrial runs, longest 8.8 seconds Patient triggered events: none   EKG:   EKG Interpretation Date/Time:  Monday November 20 2023 09:33:51 EDT Ventricular Rate:  74 PR Interval:  158 QRS Duration:  82 QT Interval:  360 QTC Calculation: 399 R Axis:   73  Text Interpretation: Sinus rhythm with Premature supraventricular complexes Nonspecific T wave abnormality When compared with ECG of 06-Nov-2023 14:03, Premature supraventricular complexes are now Present Nonspecific T wave abnormality, improved in Lateral leads Confirmed by Nancey Scotts [phone]) on 11/20/2023 9:47:32 AM     Physical Exam:    VS:  BP 102/60 (BP Location: Left Arm, Patient Position: Sitting, Cuff Size: Normal)   Pulse 74   Ht 5' (1.524 m)   Wt 124 lb (56.2 kg)   SpO2 96%   BMI 24.22 kg/m     Wt Readings from Last 3 Encounters:  11/20/23 124 lb (56.2 kg)  11/06/23 123 lb 14.4 oz (56.2 kg)  10/06/23 124 lb (56.2 kg)     GEN: Well nourished, well developed in  no acute distress CARDIAC: RRR, no murmurs, rubs, gallops RESPIRATORY:  Normal work of breathing MUSCULOSKELETAL: no edema    ASSESSMENT & PLAN:     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 EKG today shows atrial runs. Flutter wave can be seen particularly after a post-conversion pause on 07/17/23 at  03:17:47 as well as on the 07/16/23 10:52 strip. Symptoms are mild per patient, episodes are brief We discussed management options.  At present, with brief episodes of questionable flutter with controlled rates as well as atrial tachycardia, I am not sure that the risk of antiarrhythmic drugs or ablation at her age are warranted  After a second wearable monitor, I think ongoing monitoring is warranted.  I would like to start small dose of beta-blocker, but there have been concerns of bradycardia in the past.  If she has recurrence of symptoms we will need to know whether her symptoms are due to slow heart rates or arrhythmia with rapid heart rates. I explained the loop recorder implant procedure with risks of minor infection and bleeding.  I explained the monitoring process with the associated fee.  She would like to proceed With the loop recorder in place, I would plan to start a low-dose of beta-blocker.  Pauses, bradycardia Appear to be nocturnal No symptoms  Secondary hypercoagulable state CHA2DS2-VASc score is 3 I recommend continuing apixaban   Hypertension BP well-controlled today      Signed, Eulas FORBES Furbish, MD  11/20/2023 9:47 AM    [facility]

## 2023-11-20 NOTE — Patient Instructions (Signed)
 Medication Instructions:  Your physician recommends that you continue on your current medications as directed. Please refer to the Current Medication list given to you today.  *If you need a refill on your cardiac medications before your next appointment, please call your pharmacy*  Lab Work: None ordered.  If you have labs (blood work) drawn today and your tests are completely normal, you will receive your results only by: MyChart Message (if you have MyChart) OR A paper copy in the mail If you have any lab test that is abnormal or we need to change your treatment, we will call you to review the results.  Testing/Procedures: Dr Nancey would like to implant am Internal Loop Recorder for Internal Monitoring.   Follow-Up: At [facility], you and your health needs are our priority.  As part of our continuing mission to provide you with exceptional heart care, our providers are all part of one team.  This team includes your primary Cardiologist (physician) and Advanced Practice Providers or APPs (Physician Assistants and Nurse Practitioners) who all work together to provide you with the care you need, when you need it.  Your next appointment:   You will be contacted for scheduling.

## 2023-12-21 ENCOUNTER — Other Ambulatory Visit: Payer: Self-pay | Admitting: Family Medicine

## 2023-12-21 DIAGNOSIS — G8929 Other chronic pain: Secondary | ICD-10-CM

## 2024-01-09 ENCOUNTER — Encounter: Payer: Self-pay | Admitting: Cardiovascular Disease

## 2024-01-11 ENCOUNTER — Other Ambulatory Visit: Payer: Self-pay | Admitting: Family Medicine

## 2024-01-11 DIAGNOSIS — I10 Essential (primary) hypertension: Secondary | ICD-10-CM

## 2024-01-13 ENCOUNTER — Encounter: Payer: Self-pay | Admitting: Cardiovascular Disease

## 2024-01-18 ENCOUNTER — Ambulatory Visit: Admitting: Cardiovascular Disease

## 2024-01-19 ENCOUNTER — Encounter: Payer: Self-pay | Admitting: Cardiovascular Disease

## 2024-01-19 NOTE — Telephone Encounter (Signed)
 Please advise

## 2024-01-22 ENCOUNTER — Other Ambulatory Visit: Payer: Self-pay | Admitting: Family Medicine

## 2024-02-06 ENCOUNTER — Encounter: Payer: Self-pay | Admitting: Cardiovascular Disease

## 2024-02-08 ENCOUNTER — Encounter: Payer: Self-pay | Admitting: Cardiovascular Disease

## 2024-02-10 ENCOUNTER — Encounter: Payer: Self-pay | Admitting: Cardiovascular Disease

## 2024-02-12 NOTE — Telephone Encounter (Signed)
 duplicate

## 2024-02-13 NOTE — Progress Notes (Unsigned)
 " Electrophysiology Office Note:    Date:  02/14/2024   ID:  Sydney Lucas, Sydney Lucas 13-Oct-1940, MRN [id number]  PCP:  Ozell Heron HERO, MD   [facility] Providers Cardiologist:  None     Referring MD: Ozell Heron HERO, MD   History of Present Illness:    Sydney Lucas is a 84 y.o. female with a medical history significant for hypertension, referred for evaluation and management of palpitations and bradycardia.     She saw Dr. Jeffrie in November 2024 for evaluation of palpitations. at that time she noted intermittent rapid heart rates associated with stress   History of Present Illness Sydney Lucas is an 84 year old female with hypertension who presents with palpitations and bradycardia. She was referred by Dr. Farley for evaluation of palpitations and bradycardia.  She has experienced palpitations and bradycardia for several years, initially during periods of significant stress. Episodes involve a racing heart, initially thought to be panic attacks. In November 2024, intermittent rapid heart rates associated with stress were noted. In May 2025, a monitor showed a 3% burden of premature atrial contractions and a 4% burden of high atrial rates.  Current episodes last about three minutes, whereas previously they could last up to an hour and a half. She sometimes becomes aware of her heart beating fast or out of rhythm at night.  She has not taken medications like metoprolol or carvedilol due to concerns for bradycardia.  She is here today for implantation of a loop monitor.         Today, she reports that she is well and has no complaints.  EKGs/Labs/Other Studies Reviewed Today:     Echocardiogram:  TTE February 07, 2023 LVEF 60 to 65%.  Normal LV function.  Normal RV systolic function.   Monitors:  14 day monitor May 2025-- my interpretation Sinus rhythm predominates. Sinus heart rate 51 to 103 bpm, average 69 bpm 3% burden  of PACs, less than 1% burden PVCs. Nocturnal pauses occurred. 4% burden of atrial high rates --, narrow complex, regular rhythm with short PR; possible flutter with low amplitude flutter waves.  Some EKGs are consistent with a slow atrial flutter versus atrial tachycardia (eg 07/17/23 2:26:57 AM) Symptoms correlate with atrial and ventricular ectopy and also with SVT  3-day monitor September 2025 Predominant rhythm: Sinus Sinus HR: 58 - 101 bpm, AVG 72 bpm 25.5% atrial ectopy 1.5% ventricular ectopy Arrhythmia detected: Numerous atrial runs, longest 8.8 seconds Patient triggered events: none   EKG:         Physical Exam:    VS:  BP 120/68 (BP Location: Left Arm, Patient Position: Sitting, Cuff Size: Normal)   Pulse 69   Resp 16   Ht 5' (1.524 m)   Wt 126 lb 12.8 oz (57.5 kg)   SpO2 95%   BMI 24.76 kg/m     Wt Readings from Last 3 Encounters:  02/14/24 126 lb 12.8 oz (57.5 kg)  11/20/23 124 lb (56.2 kg)  11/06/23 123 lb 14.4 oz (56.2 kg)     GEN: Well nourished, well developed in no acute distress CARDIAC: RRR, no murmurs, rubs, gallops RESPIRATORY:  Normal work of breathing MUSCULOSKELETAL: no edema    ASSESSMENT & PLAN:     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 EKG today shows atrial runs. Flutter wave can be seen particularly after a post-conversion  pause on 07/17/23 at 03:17:47 as well as on the 07/16/23 10:52 strip. Symptoms are mild per patient, episodes are brief We discussed management options.  At present, with brief episodes of questionable flutter with controlled rates as well as atrial tachycardia, I am not sure that the risk of antiarrhythmic drugs or ablation at her age are warranted  After a second wearable monitor, I think ongoing monitoring is warranted.  I would like to start small dose of beta-blocker, but there have been concerns of  bradycardia in the past.  If she has recurrence of symptoms we will need to know whether her symptoms are due to slow heart rates or arrhythmia with rapid heart rates. I explained the loop recorder implant procedure with risks of minor infection and bleeding.  I explained the monitoring process with the associated fee.  She would like to proceed With the loop recorder in place, I would plan to start a low-dose of beta-blocker.  Pauses, bradycardia Appear to be nocturnal No symptoms  Secondary hypercoagulable state CHA2DS2-VASc score is 3 I recommend continuing apixaban   Hypertension BP well-controlled today    Signed, Eulas FORBES Furbish, MD  02/14/2024 8:19 AM    [facility]  SURGEON:  Eulas FORBES Furbish, MD     PREPROCEDURE DIAGNOSIS:  Arrhythmia monitoring    POSTPROCEDURE DIAGNOSIS: Arrhythmia monitoring     PROCEDURES:   1. Implantable loop recorder implantation    INTRODUCTION:  Sydney Lucas presents with a history of Arrhythmia monitoring The costs of loop recorder monitoring have been discussed with the patient.    DESCRIPTION OF PROCEDURE:  Informed written consent was obtained.  A timeout was performed. The patient required no sedation for the procedure today. The patients left chest was prepped and draped in the usual sterile fashion. The skin overlying the left parasternal region was infiltrated with lidocaine for local analgesia.  A 0.5-cm incision was made over the left parasternal region over the 3rd intercostal space.  A subcutaneous ILR pocket was fashioned using a combination of sharp and blunt dissection.  A Med[employer] Reveal LINQ 2 implantable loop recorder [id number] )was then placed into the pocket  R waves were very prominent and measured >0.29mV.  Steri- Strips and a sterile dressing were then applied.  There were no early apparent complications.     CONCLUSIONS:   1. Successful implantation of a implantable loop recorder for a history of  bradycardia, SVT  2. No early apparent complications.   Eulas FORBES Furbish, MD  Cardiac Electrophysiology    "

## 2024-02-14 ENCOUNTER — Ambulatory Visit: Attending: Cardiovascular Disease | Admitting: Cardiovascular Disease

## 2024-02-14 ENCOUNTER — Encounter: Payer: Self-pay | Admitting: Cardiovascular Disease

## 2024-02-14 VITALS — BP 120/68 | HR 69 | Resp 16 | Ht 60.0 in | Wt 126.8 lb

## 2024-02-14 DIAGNOSIS — I4892 Unspecified atrial flutter: Secondary | ICD-10-CM | POA: Diagnosis not present

## 2024-02-14 DIAGNOSIS — R001 Bradycardia, unspecified: Secondary | ICD-10-CM

## 2024-02-14 DIAGNOSIS — I480 Paroxysmal atrial fibrillation: Secondary | ICD-10-CM | POA: Diagnosis not present

## 2024-02-14 NOTE — Patient Instructions (Signed)
 Medication Instructions:  Your physician recommends that you continue on your current medications as directed. Please refer to the Current Medication list given to you today.  Labwork: None ordered.  Testing/Procedures: None ordered.  Follow-Up:  Your physician wants you to follow-up in: one year with _____________________.  You will receive a reminder letter in the mail two months in advance. If you don't receive a letter, please call our office to schedule the follow-up appointment.    Implantable Loop Recorder Placement, Care After This sheet gives you information about how to care for yourself after your procedure. Your health care provider may also give you more specific instructions. If you have problems or questions, contact your health care provider. What can I expect after the procedure? After the procedure, it is common to have: Soreness or discomfort near the incision. Some swelling or bruising near the incision.  Follow these instructions at home: Incision care  Monitor your cardiac device site for redness, swelling, and drainage. Call the device clinic at [phone] if you experience these symptoms or fever/chills.  Keep the large square bandage on your site for 24 hours and then you may remove it yourself. Keep the steri-strips underneath in place.   You may shower after 72 hours / 3 days from your procedure with the steri-strips in place. They will usually fall off on their own, or may be removed after 10 days. Pat dry.   Avoid lotions, ointments, or perfumes over your incision until it is well-healed.  Please do not submerge in water until your site is completely healed.   Your device is MRI compatible.   Remote monitoring is used to monitor your cardiac device from home. This monitoring is scheduled every month by our office. It allows [place] to keep an eye on the function of your device to ensure it is working properly.  If your wound site starts to bleed apply  pressure.    For help with the monitor please call [employer] Monitor Support Specialist directly at [phone].    If you have any questions/concerns please call the device clinic at [phone].  Activity  Return to your normal activities.  General instructions Follow instructions from your health care provider about how to manage your implantable loop recorder and transmit the information. Learn how to activate a recording if this is necessary for your type of device. You may go through a metal detection gate, and you may let someone hold a metal detector over your chest. Show your ID card if needed. Do not have an MRI unless you check with your health care provider first. Take over-the-counter and prescription medicines only as told by your health care provider. Keep all follow-up visits as told by your health care provider. This is important. Contact a health care provider if: You have redness, swelling, or pain around your incision. You have a fever. You have pain that is not relieved by your pain medicine. You have triggered your device because of fainting (syncope) or because of a heartbeat that feels like it is racing, slow, fluttering, or skipping (palpitations). Get help right away if you have: Chest pain. Difficulty breathing. Summary After the procedure, it is common to have soreness or discomfort near the incision. Change your dressing as told by your health care provider. Follow instructions from your health care provider about how to manage your implantable loop recorder and transmit the information. Keep all follow-up visits as told by your health care provider. This is important. This information is  not intended to replace advice given to you by your health care provider. Make sure you discuss any questions you have with your health care provider. Document Released: 01/05/2015 Document Revised: 03/11/2017 Document Reviewed: 03/11/2017 Elsevier Patient Education   2020 [employer].

## 2024-02-15 ENCOUNTER — Other Ambulatory Visit: Payer: Self-pay | Admitting: Family Medicine

## 2024-02-15 DIAGNOSIS — F419 Anxiety disorder, unspecified: Secondary | ICD-10-CM

## 2024-02-28 ENCOUNTER — Encounter: Payer: Self-pay | Admitting: Cardiovascular Disease

## 2024-03-07 ENCOUNTER — Encounter: Payer: Self-pay | Admitting: Cardiovascular Disease

## 2024-03-11 ENCOUNTER — Other Ambulatory Visit: Payer: Self-pay | Admitting: Cardiology

## 2024-03-11 DIAGNOSIS — I480 Paroxysmal atrial fibrillation: Secondary | ICD-10-CM

## 2024-03-16 ENCOUNTER — Ambulatory Visit

## 2024-04-16 ENCOUNTER — Ambulatory Visit

## 2024-04-17 ENCOUNTER — Ambulatory Visit: Admitting: Cardiovascular Disease

## 2024-05-17 ENCOUNTER — Ambulatory Visit

## 2024-06-17 ENCOUNTER — Ambulatory Visit

## 2024-10-11 ENCOUNTER — Ambulatory Visit
